# Patient Record
Sex: Female | Born: 1980 | Race: White | Hispanic: No | Marital: Married | Smoking: Never smoker
Health system: Southern US, Community
[De-identification: ages and names within clinical notes are randomized; demographics above are authoritative.]

## PROBLEM LIST (undated history)

## (undated) DIAGNOSIS — IMO0002 Reserved for concepts with insufficient information to code with codable children: Secondary | ICD-10-CM

## (undated) DIAGNOSIS — E049 Nontoxic goiter, unspecified: Secondary | ICD-10-CM

## (undated) DIAGNOSIS — N200 Calculus of kidney: Secondary | ICD-10-CM

## (undated) DIAGNOSIS — N390 Urinary tract infection, site not specified: Secondary | ICD-10-CM

## (undated) DIAGNOSIS — B379 Candidiasis, unspecified: Secondary | ICD-10-CM

## (undated) HISTORY — DX: Urinary tract infection, site not specified: N39.0

## (undated) HISTORY — PX: HYSTEROSCOPY: SHX211

## (undated) HISTORY — DX: Candidiasis, unspecified: B37.9

## (undated) HISTORY — DX: Reserved for concepts with insufficient information to code with codable children: IMO0002

## (undated) HISTORY — PX: WISDOM TOOTH EXTRACTION: SHX21

## (undated) HISTORY — DX: Nontoxic goiter, unspecified: E04.9

## (undated) HISTORY — DX: Calculus of kidney: N20.0

---

## 2004-04-19 ENCOUNTER — Other Ambulatory Visit: Admission: RE | Admit: 2004-04-19 | Discharge: 2004-04-19 | Payer: Self-pay | Admitting: Obstetrics and Gynecology

## 2005-01-24 ENCOUNTER — Other Ambulatory Visit: Admission: RE | Admit: 2005-01-24 | Discharge: 2005-01-24 | Payer: Self-pay | Admitting: Obstetrics and Gynecology

## 2005-01-25 ENCOUNTER — Ambulatory Visit (HOSPITAL_COMMUNITY): Admission: RE | Admit: 2005-01-25 | Discharge: 2005-01-25 | Payer: Self-pay | Admitting: Endocrinology

## 2005-05-15 ENCOUNTER — Other Ambulatory Visit: Admission: RE | Admit: 2005-05-15 | Discharge: 2005-05-15 | Payer: Self-pay | Admitting: Obstetrics and Gynecology

## 2006-05-17 ENCOUNTER — Other Ambulatory Visit: Admission: RE | Admit: 2006-05-17 | Discharge: 2006-05-17 | Payer: Self-pay | Admitting: Obstetrics & Gynecology

## 2007-06-06 ENCOUNTER — Other Ambulatory Visit: Admission: RE | Admit: 2007-06-06 | Discharge: 2007-06-06 | Payer: Self-pay | Admitting: Obstetrics and Gynecology

## 2007-10-18 ENCOUNTER — Ambulatory Visit (HOSPITAL_COMMUNITY): Admission: RE | Admit: 2007-10-18 | Discharge: 2007-10-18 | Payer: Self-pay | Admitting: Obstetrics & Gynecology

## 2008-06-09 ENCOUNTER — Other Ambulatory Visit: Admission: RE | Admit: 2008-06-09 | Discharge: 2008-06-09 | Payer: Self-pay | Admitting: Obstetrics & Gynecology

## 2011-01-31 NOTE — Op Note (Signed)
Kelsey Cox, Kelsey Cox             ACCOUNT NO.:  [id number]   MEDICAL RECORD NO.:  [id number]          PATIENT TYPE:  AMB   LOCATION:  SDC                           FACILITY:  [facility]   PHYSICIAN:  M. Leda Quail, MD  DATE OF BIRTH:  [birth year]   DATE OF PROCEDURE:  10/18/2007  DATE OF DISCHARGE:                               OPERATIVE REPORT   PREOPERATIVE DIAGNOSES:  1. [age]-year-old gravida 0 single white female with postcoital      bleeding.  2. Endocervical polyp.  3. Goiter.   POSTOPERATIVE DIAGNOSES:  1. [age]-year-old gravida 0 single white female with postcoital      bleeding.  2. Endocervical polyp.  3. Goiter.   PROCEDURE:  1. Hysteroscopy.  2. Dilatation and curettage.   SURGEON:  M. Leda Quail, MD   ASSISTANT:  OR staff.   ANESTHESIA:  MAC.   FINDINGS:  Small endocervical polyps at the internal os of the cervix.   SPECIMENS:  Endometrial curettings.   DISPOSITION OF SPECIMEN:  Sent to Pathology.   ESTIMATED BLOOD LOSS:  Minimal.   FLUIDS:  500 mL of LR.   URINE OUTPUT:  50 mL.   COMPLICATIONS:  None.   INDICATIONS:  Kelsey Cox is a very nice 30 year old G0 single white  female with a history of postcoital bleeding.  She has a sonohysterogram  in the office that showed a polyp at the level of the internal os.  She  and I discussed conservative management versus removal.  She has been  counseled on risks and benefits.  The patient has decided to go ahead  and proceed with this.  She has not been assured any guarantees about  this and knows that she may even continue with the bleeding with this  gone.  The patient is here with her mother and ready to proceed.   PROCEDURE:  The patient was taken to the operating room.  She was placed  in supine position.  MAC anesthesia was administered by the anesthesia  staff without difficulty; Dr. Jean Rosenthal oversaw the case.  After  anesthesia had been induced, the patient's legs were positioned  in the  dorsal lithotomy position in the Dixon stirrups.  Perineum, inner  thighs, and vagina were prepped and draped in the normal sterile  fashion.  The legs were lifted up.  A red rubber Foley catheter was used  to drain the bladder of all urine.  A bivalve speculum was placed in the  vagina.  The cervix was well visualized.  The anterior lip of the cervix  was grasped with a single-tooth tenaculum.  The cervix was anesthetized  with 1% lidocaine mixed 1:1 with epinephrine (1:100,000 units); 10 mL in  total were instilled in the cervix; this instillation was placed at the  3, 6, 9 and 12 o'clock position.  Using a sound, the uterus sounds to 7  cm.  Then using Pratt dilators, the cervix dilated up to #17.  A 2.9-mm  hysteroscope was obtained.  It was passed through the cervical os.  Sorbitol 3% was used for the procedure.  The endometrial cavity  was well  visualized.  Tubal ostia were noted.  There was a small polyp that was  noted at the level of the internal os.  Scope was removed.  A #1 curette  was used to curette the entire endometrial cavity till a rough gritty  texture was noted.  A Kevorkian endocervical curette was used to curette  the endocervical canal.  The small polyp was obtained in this.  Using  the scope, the canal and cavity were revisualized.  At this point, the  procedure was ended.  All instruments were removed from the vagina.  The  tenaculum was removed from the cervix.  The patient did have a little  bleeding from the cervix, which was made hemostatic with silver nitrate.   The patient tolerated the procedure well.  She was awakened from  anesthesia.  Sponge, lap, needle and instrument counts were correct x2.  She was taken to the recovery room in stable addition.      Kelsey Keas, MD  Electronically Signed     MSM/MEDQ  D:  10/18/2007  T:  10/18/2007  Job:  [id number]

## 2011-06-09 LAB — URINALYSIS, ROUTINE W REFLEX MICROSCOPIC
Glucose, UA: NEGATIVE
Hgb urine dipstick: NEGATIVE
Nitrite: NEGATIVE
Specific Gravity, Urine: 1.02
pH: 6

## 2011-06-09 LAB — CBC
MCV: 90.6
RDW: 12.4

## 2013-03-05 ENCOUNTER — Telehealth: Payer: Self-pay | Admitting: Obstetrics & Gynecology

## 2013-03-05 NOTE — Telephone Encounter (Signed)
Patient was referred to [facility]. She was to see Dr. Levonne Hubert . She call to let us that he has left the practice unexpectedly ? She would like to know where he went or what? Were we aware ?  Would like to speak to Dr. Hyacinth Meeker herself if possible. Patient is very upset and wants to know where to go from here.

## 2013-03-06 ENCOUNTER — Telehealth: Payer: Self-pay | Admitting: *Deleted

## 2013-03-06 NOTE — Telephone Encounter (Signed)
Return call to sally

## 2013-03-06 NOTE — Telephone Encounter (Signed)
Returning call to patient, LMTCB.

## 2013-03-06 NOTE — Telephone Encounter (Signed)
Call to patient and notified that Dr April Manson will be opening a new practice and phone number of [phone] is given to patient.  Advised I do not know the specifics of opening.  She will contact that office and check on it and if needed will check back with [place] to see if we can help her with Femara until he opens.  She was preparing for IUI which she knows we cant do but she would at least be interested in staying on Femara.  She is aware to call back if she needs assistance after speaking with the new office.

## 2013-09-26 ENCOUNTER — Encounter: Payer: Self-pay | Admitting: Obstetrics & Gynecology

## 2013-09-29 ENCOUNTER — Encounter: Payer: Self-pay | Admitting: Obstetrics & Gynecology

## 2013-09-29 ENCOUNTER — Ambulatory Visit (INDEPENDENT_AMBULATORY_CARE_PROVIDER_SITE_OTHER): Payer: BC Managed Care – PPO | Admitting: Obstetrics & Gynecology

## 2013-09-29 VITALS — BP 118/78 | HR 68 | Resp 16 | Ht 66.75 in | Wt 132.8 lb

## 2013-09-29 DIAGNOSIS — Z0000 Encounter for general adult medical examination without abnormal findings: Secondary | ICD-10-CM

## 2013-09-29 DIAGNOSIS — Z01419 Encounter for gynecological examination (general) (routine) without abnormal findings: Secondary | ICD-10-CM

## 2013-09-29 LAB — POCT URINALYSIS DIPSTICK
Bilirubin, UA: NEGATIVE
Glucose, UA: NEGATIVE
KETONES UA: NEGATIVE
Leukocytes, UA: NEGATIVE
Nitrite, UA: NEGATIVE
PH UA: 7
PROTEIN UA: NEGATIVE
RBC UA: NEGATIVE
UROBILINOGEN UA: NEGATIVE

## 2013-09-29 NOTE — Patient Instructions (Signed)

## 2013-09-29 NOTE — Progress Notes (Signed)
33 y.o. [id number] Married CaucasianF here for annual exam.  Cycles are regular.  Seeing Dr. April Manson for fertility issues.  Took Clomid for several months but endometrial lining was very thin.  Used Kelsey Cox.  Also, used injectables.   Did have a pregnancy but miscarried.  Kelsey Cox was about [redacted] weeks along.  That cycle was heavier and with clots.    Kelsey Cox's last menstrual period was 09/08/2013.          Sexually active: yes  The current method of family planning is none.    Exercising: yes  walking on occasion Smoker:  no  Health Maintenance: Pap:  08/23/12 WNL/negative HR HPV History of abnormal Pap:  no MMG:  none Colonoscopy:  none BMD:   none TDaP:  10/12 Screening Labs: n/a today, Hb today: 13.2, Urine today: PH-7.0   reports that Kelsey Cox has never smoked. Kelsey Cox has never used smokeless tobacco. Kelsey Cox reports that Kelsey Cox drinks about 3.5 ounces of alcohol per week. Kelsey Cox reports that Kelsey Cox does not use illicit drugs.  Past Medical History  Diagnosis Date  . Renal stone     age 36, passed  . Euthyroid goiter   . Yeast infection     chronic  . UTI (urinary tract infection)   . 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 Hysteroscopy      with polyp resection  . Wisdom tooth extraction      Current Outpatient Prescriptions  Medication Sig Dispense Refill  . Ascorbic Acid (VITAMIN C PO) Take by mouth daily.      . Coenzyme Q10 (CO Q-10 PO) Take by mouth daily.      . Prenatal Vit-Fe Fumarate-FA (PRENATAL ONE DAILY PO) Take by mouth.      . Probiotic Product (PROBIOTIC DAILY PO) Take by mouth. jarrow daily       No current facility-administered medications for this visit.    Family History  Problem Relation Age of Onset  . Diabetes Father   . Diabetes Paternal Grandmother   . Breast cancer Mother 23  . Breast cancer Maternal Grandmother 22  . Hypertension Father   . Heart attack Paternal Grandfather   . Cancer Paternal Grandmother     GI  cancer, lung cancer    ROS:  Pertinent items are noted in HPI.  Otherwise, a comprehensive ROS was negative.  Exam:   BP 118/78  Pulse 68  Resp 16  Ht 5' 6.75" (1.695 m)  Wt 132 lb 12.8 oz (60.238 kg)  BMI 20.97 kg/m2  LMP 09/08/2013  Weight change: +3lb   Height: 5' 6.75" (169.5 cm)  Ht Readings from Last 3 Encounters:  09/29/13 5' 6.75" (1.695 m)    General appearance: alert, cooperative and appears stated age Head: Normocephalic, without obvious abnormality, atraumatic Neck: no adenopathy, supple, symmetrical, trachea midline and thyroid normal to inspection and palpation Lungs: clear to auscultation bilaterally Breasts: normal appearance, no masses or tenderness Heart: regular rate and rhythm Abdomen: soft, non-tender; bowel sounds normal; no masses,  no organomegaly Extremities: extremities normal, atraumatic, no cyanosis or edema Skin: Skin color, texture, turgor normal. No rashes or lesions Lymph nodes: Cervical, supraclavicular, and axillary nodes normal. No abnormal inguinal nodes palpated Neurologic: Grossly normal   Pelvic: External genitalia:  no lesions              Urethra:  normal appearing urethra with no masses, tenderness or lesions  Bartholins and Skenes: normal                 Vagina: normal appearing vagina with normal color and discharge, no lesions              Cervix: no lesions              Pap taken: no Bimanual Exam:  Uterus:  normal size, contour, position, consistency, mobility, non-tender              Adnexa: normal adnexa and no mass, fullness, tenderness               Rectovaginal: Confirms               Anus:  normal sphincter tone, no lesions  A:  Well Woman with normal exam Primary infertility, planning IVF  P:   Mammogram starting age 33 pap smear 08/23/12 with neg HR HPV.  No pap indicated today. No labs today. return annually or prn  An After Visit Summary was printed and given to the Kelsey Cox.

## 2013-09-30 LAB — HEMOGLOBIN, FINGERSTICK: HEMOGLOBIN, FINGERSTICK: 13.2 g/dL (ref 12.0–16.0)

## 2013-09-30 LAB — TSH: TSH: 2.91 u[IU]/mL (ref 0.350–4.500)

## 2013-10-06 ENCOUNTER — Telehealth: Payer: Self-pay | Admitting: Obstetrics & Gynecology

## 2013-10-06 NOTE — Telephone Encounter (Signed)
Return call to patient. Patient is checking on TSH from last week since she is early pregnant.  She states she had faint positive UPT's at home and has been to Dr April MansonYalcinkaya and is waiting to hear back from them on blood test results.  Advised TSH from last week WNL as well as HGB.  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 Requested I document this in her chart and let Dr Hyacinth MeekerMiller know.  Will fax labs to Dr April MansonYalcinkaya.  Routing to provider for final review. Patient agreeable to disposition. Will close encounter

## 2013-10-06 NOTE — Telephone Encounter (Signed)
Patient is asking for recent lab results.

## 2014-05-29 ENCOUNTER — Encounter (HOSPITAL_COMMUNITY): Payer: Self-pay

## 2014-06-01 ENCOUNTER — Encounter (HOSPITAL_COMMUNITY)
Admission: AD | Disposition: A | Discharge status: Home / Self Care | Payer: Self-pay | Source: Ambulatory Visit | Attending: Obstetrics and Gynecology

## 2014-06-01 ENCOUNTER — Encounter (HOSPITAL_COMMUNITY): Payer: Self-pay | Admitting: *Deleted

## 2014-06-01 ENCOUNTER — Encounter (HOSPITAL_COMMUNITY): Payer: BC Managed Care – PPO | Admitting: Anesthesiology

## 2014-06-01 ENCOUNTER — Inpatient Hospital Stay (HOSPITAL_COMMUNITY)
Admission: AD | Admit: 2014-06-01 | Discharge: 2014-06-04 | DRG: 766 | Disposition: A | Discharge status: Home / Self Care | Payer: BC Managed Care – PPO | Source: Ambulatory Visit | Attending: Obstetrics and Gynecology | Admitting: Obstetrics and Gynecology

## 2014-06-01 ENCOUNTER — Inpatient Hospital Stay (HOSPITAL_COMMUNITY): Payer: BC Managed Care – PPO | Admitting: Anesthesiology

## 2014-06-01 DIAGNOSIS — O321XX0 Maternal care for breech presentation, not applicable or unspecified: Secondary | ICD-10-CM | POA: Diagnosis present

## 2014-06-01 DIAGNOSIS — Z8249 Family history of ischemic heart disease and other diseases of the circulatory system: Secondary | ICD-10-CM | POA: Diagnosis not present

## 2014-06-01 DIAGNOSIS — Z87442 Personal history of urinary calculi: Secondary | ICD-10-CM | POA: Diagnosis not present

## 2014-06-01 DIAGNOSIS — Z803 Family history of malignant neoplasm of breast: Secondary | ICD-10-CM | POA: Diagnosis not present

## 2014-06-01 DIAGNOSIS — Z833 Family history of diabetes mellitus: Secondary | ICD-10-CM

## 2014-06-01 LAB — TYPE AND SCREEN
ABO/RH(D): O POS
ANTIBODY SCREEN: NEGATIVE

## 2014-06-01 LAB — CBC
HCT: 38.3 % (ref 36.0–46.0)
Hemoglobin: 13.3 g/dL (ref 12.0–15.0)
MCH: 31.7 pg (ref 26.0–34.0)
MCHC: 34.7 g/dL (ref 30.0–36.0)
MCV: 91.4 fL (ref 78.0–100.0)
Platelets: 146 10*3/uL — ABNORMAL LOW (ref 150–400)
RBC: 4.19 MIL/uL (ref 3.87–5.11)
RDW: 13.5 % (ref 11.5–15.5)
WBC: 13.2 10*3/uL — AB (ref 4.0–10.5)

## 2014-06-01 SURGERY — Surgical Case
Anesthesia: Spinal | Site: Abdomen

## 2014-06-01 MED ORDER — BUPIVACAINE HCL (PF) 0.25 % IJ SOLN
INTRAMUSCULAR | Status: DC | PRN
  Administered 2014-06-01: 10 mL

## 2014-06-01 MED ORDER — CITRIC ACID-SODIUM CITRATE 334-500 MG/5ML PO SOLN
30.0000 mL | Freq: Once | ORAL | Status: AC
  Administered 2014-06-01: 30 mL via ORAL
  Filled 2014-06-01: qty 15

## 2014-06-01 MED ORDER — LACTATED RINGERS IV SOLN
INTRAVENOUS | Status: DC | PRN
  Administered 2014-06-01 (×2): via INTRAVENOUS

## 2014-06-01 MED ORDER — PHENYLEPHRINE 8 MG IN D5W 100 ML (0.08MG/ML) PREMIX OPTIME
INJECTION | INTRAVENOUS | Status: DC | PRN
  Administered 2014-06-01: 60 ug/min via INTRAVENOUS

## 2014-06-01 MED ORDER — SCOPOLAMINE 1 MG/3DAYS TD PT72
MEDICATED_PATCH | TRANSDERMAL | Status: AC
  Administered 2014-06-01: 1.5 mg via TRANSDERMAL
  Filled 2014-06-01: qty 1

## 2014-06-01 MED ORDER — FENTANYL CITRATE 0.05 MG/ML IJ SOLN
INTRAMUSCULAR | Status: DC | PRN
  Administered 2014-06-01: 20 ug via INTRATHECAL

## 2014-06-01 MED ORDER — ONDANSETRON HCL 4 MG/2ML IJ SOLN
INTRAMUSCULAR | Status: AC
  Filled 2014-06-01: qty 2

## 2014-06-01 MED ORDER — OXYTOCIN 10 UNIT/ML IJ SOLN
40.0000 [IU] | INTRAVENOUS | Status: DC | PRN
  Administered 2014-06-01: 40 [IU] via INTRAVENOUS

## 2014-06-01 MED ORDER — SCOPOLAMINE 1 MG/3DAYS TD PT72
1.0000 | MEDICATED_PATCH | Freq: Once | TRANSDERMAL | Status: DC
  Administered 2014-06-01: 1.5 mg via TRANSDERMAL

## 2014-06-01 MED ORDER — BUPIVACAINE IN DEXTROSE 0.75-8.25 % IT SOLN
INTRATHECAL | Status: DC | PRN
  Administered 2014-06-01: 1.6 mL via INTRATHECAL

## 2014-06-01 MED ORDER — BUPIVACAINE HCL (PF) 0.25 % IJ SOLN
INTRAMUSCULAR | Status: AC
  Filled 2014-06-01: qty 10

## 2014-06-01 MED ORDER — PHENYLEPHRINE 8 MG IN D5W 100 ML (0.08MG/ML) PREMIX OPTIME
INJECTION | INTRAVENOUS | Status: AC
  Filled 2014-06-01: qty 100

## 2014-06-01 MED ORDER — 0.9 % SODIUM CHLORIDE (POUR BTL) OPTIME
TOPICAL | Status: DC | PRN
  Administered 2014-06-01: 1000 mL

## 2014-06-01 MED ORDER — OXYTOCIN 10 UNIT/ML IJ SOLN
INTRAMUSCULAR | Status: AC
  Filled 2014-06-01: qty 4

## 2014-06-01 MED ORDER — CEFAZOLIN SODIUM-DEXTROSE 2-3 GM-% IV SOLR
2.0000 g | INTRAVENOUS | Status: AC
  Administered 2014-06-01: 2 g via INTRAVENOUS
  Filled 2014-06-01: qty 50

## 2014-06-01 MED ORDER — MORPHINE SULFATE 0.5 MG/ML IJ SOLN
INTRAMUSCULAR | Status: AC
  Filled 2014-06-01: qty 10

## 2014-06-01 MED ORDER — FENTANYL CITRATE 0.05 MG/ML IJ SOLN
INTRAMUSCULAR | Status: AC
  Filled 2014-06-01: qty 2

## 2014-06-01 MED ORDER — LACTATED RINGERS IV SOLN
INTRAVENOUS | Status: DC | PRN
  Administered 2014-06-01: 23:00:00 via INTRAVENOUS

## 2014-06-01 MED ORDER — MORPHINE SULFATE (PF) 0.5 MG/ML IJ SOLN
INTRAMUSCULAR | Status: DC | PRN
  Administered 2014-06-01: .2 mg via INTRATHECAL

## 2014-06-01 MED ORDER — DEXTROSE IN LACTATED RINGERS 5 % IV SOLN
INTRAVENOUS | Status: DC
  Administered 2014-06-01: 21:00:00 via INTRAVENOUS

## 2014-06-01 MED ORDER — ONDANSETRON HCL 4 MG/2ML IJ SOLN
INTRAMUSCULAR | Status: DC | PRN
  Administered 2014-06-01: 4 mg via INTRAVENOUS

## 2014-06-01 SURGICAL SUPPLY — 40 items
ADH SKN CLS LQ APL DERMABOND (GAUZE/BANDAGES/DRESSINGS) ×1
BLADE SURG 10 STRL SS (BLADE) ×4 IMPLANT
CLAMP CORD UMBIL (MISCELLANEOUS) IMPLANT
CLOTH BEACON ORANGE TIMEOUT ST (SAFETY) ×2 IMPLANT
CONTAINER PREFILL 10% NBF 15ML (MISCELLANEOUS) IMPLANT
DERMABOND ADHESIVE PROPEN (GAUZE/BANDAGES/DRESSINGS) ×1
DERMABOND ADVANCED .7 DNX6 (GAUZE/BANDAGES/DRESSINGS) IMPLANT
DRAPE LG THREE QUARTER DISP (DRAPES) IMPLANT
DRSG OPSITE POSTOP 4X10 (GAUZE/BANDAGES/DRESSINGS) ×2 IMPLANT
DURAPREP 26ML APPLICATOR (WOUND CARE) ×2 IMPLANT
ELECT REM PT RETURN 9FT ADLT (ELECTROSURGICAL) ×2
ELECTRODE REM PT RTRN 9FT ADLT (ELECTROSURGICAL) ×1 IMPLANT
EXTRACTOR VACUUM M CUP 4 TUBE (SUCTIONS) IMPLANT
GLOVE BIO SURGEON STRL SZ7.5 (GLOVE) ×2 IMPLANT
GOWN STRL REUS W/TWL LRG LVL3 (GOWN DISPOSABLE) ×4 IMPLANT
KIT ABG SYR 3ML LUER SLIP (SYRINGE) IMPLANT
NDL HYPO 25X1 1.5 SAFETY (NEEDLE) ×1 IMPLANT
NDL HYPO 25X5/8 SAFETYGLIDE (NEEDLE) IMPLANT
NDL SPNL 20GX3.5 QUINCKE YW (NEEDLE) IMPLANT
NEEDLE HYPO 25X1 1.5 SAFETY (NEEDLE) ×2 IMPLANT
NEEDLE HYPO 25X5/8 SAFETYGLIDE (NEEDLE) IMPLANT
NEEDLE SPNL 20GX3.5 QUINCKE YW (NEEDLE) IMPLANT
NS IRRIG 1000ML POUR BTL (IV SOLUTION) ×2 IMPLANT
PACK C SECTION WH (CUSTOM PROCEDURE TRAY) ×2 IMPLANT
STAPLER VISISTAT 35W (STAPLE) IMPLANT
SUT MNCRL 0 VIOLET CTX 36 (SUTURE) ×2 IMPLANT
SUT MNCRL AB 3-0 PS2 27 (SUTURE) IMPLANT
SUT MON AB 2-0 CT1 27 (SUTURE) ×2 IMPLANT
SUT MON AB-0 CT1 36 (SUTURE) ×4 IMPLANT
SUT MONOCRYL 0 CTX 36 (SUTURE) ×2
SUT PLAIN 0 NONE (SUTURE) IMPLANT
SUT PLAIN 2 0 (SUTURE)
SUT PLAIN 2 0 XLH (SUTURE) IMPLANT
SUT PLAIN ABS 2-0 CT1 27XMFL (SUTURE) IMPLANT
SYRINGE 20CC LL (MISCELLANEOUS) IMPLANT
SYRINGE CONTROL L 12CC (SYRINGE) ×2 IMPLANT
SYRINGE CONTROL LL 12CC (SYRINGE) ×1 IMPLANT
TOWEL OR 17X24 6PK STRL BLUE (TOWEL DISPOSABLE) ×2 IMPLANT
TRAY FOLEY CATH 14FR (SET/KITS/TRAYS/PACK) ×2 IMPLANT
WATER STERILE IRR 1000ML POUR (IV SOLUTION) ×2 IMPLANT

## 2014-06-01 NOTE — Anesthesia Preprocedure Evaluation (Signed)
Anesthesia Evaluation  Patient identified by MRN, date of birth, ID band Patient awake    Reviewed: Allergy & Precautions, H&P , NPO status , Patient's Chart, lab work & pertinent test results  History of Anesthesia Complications Negative for: history of anesthetic complications  Airway Mallampati: II TM Distance: >3 FB Neck ROM: Full    Dental  (+) Teeth Intact   Pulmonary neg pulmonary ROS,  breath sounds clear to auscultation        Cardiovascular negative cardio ROS  Rhythm:Regular     Neuro/Psych negative neurological ROS     GI/Hepatic negative GI ROS, Neg liver ROS,   Endo/Other  negative endocrine ROS  Renal/GU negative Renal ROS     Musculoskeletal   Abdominal   Peds  Hematology negative hematology ROS (+)   Anesthesia Other Findings   Reproductive/Obstetrics (+) Pregnancy                           Anesthesia Physical Anesthesia Plan  ASA: II and emergent  Anesthesia Plan: Spinal   Post-op Pain Management:    Induction:   Airway Management Planned: Natural Airway  Additional Equipment:   Intra-op Plan:   Post-operative Plan:   Informed Consent: I have reviewed the patients History and Physical, chart, labs and discussed the procedure including the risks, benefits and alternatives for the proposed anesthesia with the patient or authorized representative who has indicated his/her understanding and acceptance.   Dental advisory given  Plan Discussed with: CRNA and Surgeon  Anesthesia Plan Comments:         Anesthesia Quick Evaluation

## 2014-06-01 NOTE — MAU Note (Signed)
PT SAYS SHE WAS IN OFFICE TODAY-   THINKS SROM  AT 745PM.    VE - FT.  DENIES HSV AND MRSA.  GBS-  NEG.    BABY - BREECH

## 2014-06-01 NOTE — Transfer of Care (Signed)
Immediate Anesthesia Transfer of Care Note  Patient: Kelsey Cox  Procedure(s) Performed: Procedure(s): CESAREAN SECTION (N/A)  Patient Location: PACU  Anesthesia Type:Spinal  Level of Consciousness: awake, alert  and oriented  Airway & Oxygen Therapy: Patient Spontanous Breathing  Post-op Assessment: Report given to PACU RN and Post -op Vital signs reviewed and stable  Post vital signs: Reviewed and stable  Complications: No apparent anesthesia complications

## 2014-06-01 NOTE — Op Note (Signed)
Cesarean Section Procedure Note  Indications: malpresentation: complete breech and SROM in labor  Pre-operative Diagnosis: 38 week 2 day pregnancy.  Post-operative Diagnosis: same  Surgeon: Lenoard Aden   Assistants: Denyse Amass, CNM  Anesthesia: Local anesthesia 0.25.% bupivacaine and Spinal anesthesia  ASA Class: 2  Procedure Details  The patient was seen in the Holding Room. The risks, benefits, complications, treatment options, and expected outcomes were discussed with the patient.  The patient concurred with the proposed plan, giving informed consent. The risks of anesthesia, infection, bleeding and possible injury to other organs discussed. Injury to bowel, bladder, or ureter with possible need for repair discussed. Possible need for transfusion with secondary risks of hepatitis or HIV acquisition discussed. Post operative complications to include but not limited to DVT, PE and Pneumonia noted. The site of surgery properly noted/marked. The patient was taken to Operating Room # 9, identified as AIRI COPADO and the procedure verified as C-Section Delivery. A Time Out was held and the above information confirmed.  After induction of anesthesia, the patient was draped and prepped in the usual sterile manner. A Pfannenstiel incision was made and carried down through the subcutaneous tissue to the fascia. Fascial incision was made and extended transversely using Mayo scissors. The fascia was separated from the underlying rectus tissue superiorly and inferiorly. The peritoneum was identified and entered. Peritoneal incision was extended longitudinally. The utero-vesical peritoneal reflection was incised transversely and the bladder flap was bluntly freed from the lower uterine segment. A low transverse uterine incision(Kerr hysterotomy) was made. Delivered from complete breech presentation with standard maneuvers was a  female with Apgar scores of 9 at one minute and 9 at five minutes. Bulb  suctioning gently performed. Neonatal team in attendance.After the umbilical cord was clamped and cut cord blood was obtained for evaluation. The placenta was removed intact and appeared normal. The uterus was curetted with a dry lap pack. Good hemostasis was noted.The uterine outline, tubes and ovaries appeared normal. The uterine incision was closed with running locked sutures of 0 Monocryl x 2 layers. Hemostasis was observed. Lavage was carried out until clear.The parietal peritoneum was closed with a running 2-0 Monocryl suture. The fascia was then reapproximated with running sutures of 0 Monocryl. The skin was reapproximated with 3-39monocryl after 2-0 plain on [place] layer.  Instrument, sponge, and needle counts were correct prior the abdominal closure and at the conclusion of the case.   Findings: As above  Estimated Blood Loss:  300 mL         Drains: foley                 Specimens: placenta                 Complications:  None; patient tolerated the procedure well.         Disposition: PACU - hemodynamically stable.         Condition: stable  Attending Attestation: I performed the procedure.

## 2014-06-01 NOTE — Anesthesia Procedure Notes (Signed)
Spinal  Patient location during procedure: OR Start time: 06/01/2014 10:07 PM End time: 06/01/2014 10:11 PM Staffing Anesthesiologist: Xaniyah Buchholz, CHRIS Preanesthetic Checklist Completed: patient identified, surgical consent, pre-op evaluation, timeout performed, IV checked, risks and benefits discussed and monitors and equipment checked Spinal Block Patient position: sitting Prep: site prepped and draped and DuraPrep Patient monitoring: heart rate, cardiac monitor, continuous pulse ox and blood pressure Approach: midline Location: L4-5 Injection technique: single-shot Needle Needle type: Pencan  Needle gauge: 24 G Needle length: 10 cm Assessment Sensory level: T4

## 2014-06-01 NOTE — H&P (Signed)
Kelsey Cox is a 33 y.o. female presenting for sROM and breech.  Maternal Medical History:  Reason for admission: Rupture of membranes.   Contractions: Onset was less than 1 hour ago.   Frequency: irregular.   Perceived severity is mild.    Fetal activity: Perceived fetal activity is normal.   Last perceived fetal movement was within the past hour.    Prenatal complications: no prenatal complications Prenatal Complications - Diabetes: none.    OB History   Grav Para Term Preterm Abortions TAB SAB Ect Mult Living   2 0   1  1   0     Past Medical History  Diagnosis Date  . Renal stone     age 1, passed  . Euthyroid goiter   . Yeast infection     chronic  . UTI (urinary tract infection)   . 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 Hysteroscopy      with polyp resection  . Wisdom tooth extraction     Family History: family history includes Breast cancer (age of onset: 76) in her mother; Breast cancer (age of onset: 43) in her maternal grandmother; Cancer in her paternal grandmother; Diabetes in her father and paternal grandmother; Heart attack in her paternal grandfather; Hypertension in her father. Social History:  reports that she has never smoked. She has never used smokeless tobacco. She reports that she drinks about 3.5 ounces of alcohol per week. She reports that she does not use illicit drugs.   Prenatal Transfer Tool  Maternal Diabetes: No Genetic Screening: Normal Maternal Ultrasounds/Referrals: Normal Fetal Ultrasounds or other Referrals:  None Maternal Substance Abuse:  No Significant Maternal Medications:  None Significant Maternal Lab Results:  None Other Comments:  None  Review of Systems  All other systems reviewed and are negative.     Blood pressure 140/79, pulse 79, temperature 98.9 F (37.2 C), temperature source Oral, resp. rate 20, height 5' 5.5" (1.664 m), weight 67.699 kg (149 lb 4 oz),  last menstrual period 09/08/2013, unknown if currently breastfeeding. Maternal Exam:  Uterine Assessment: Contraction strength is mild.  Contraction frequency is irregular.   Abdomen: Patient reports no abdominal tenderness. Fetal presentation: breech  Introitus: Normal vulva. Normal vagina.  Ferning test: positive.  Nitrazine test: positive. Amniotic fluid character: clear.  Pelvis: adequate for delivery.   Cervix: Cervix evaluated by sterile speculum exam and digital exam.     Physical Exam  Nursing note and vitals reviewed. Constitutional: She is oriented to person, place, and time. She appears well-developed and well-nourished.  HENT:  Head: Normocephalic.  Eyes: Pupils are equal, round, and reactive to light.  Neck: Normal range of motion. Neck supple.  Cardiovascular: Normal rate and regular rhythm.   Respiratory: Effort normal and breath sounds normal.  GI: Soft.  Genitourinary: Vagina normal and uterus normal.  Musculoskeletal: Normal range of motion.  Neurological: She is alert and oriented to person, place, and time.  Skin: Skin is warm and dry.  Psychiatric: She has a normal mood and affect. Her behavior is normal. Judgment and thought content normal.    Prenatal labs: ABO, Rh:   Antibody:   Rubella:   RPR:    HBsAg:    HIV:    GBS:     Assessment/Plan: SROM with malpresentation. Advanced dilatation with unstable lie Proceed with Csection. Risks vs benefits of surgery previously discussed. OR notified.   Kayton Dunaj J  06/01/2014, 9:08 PM

## 2014-06-02 ENCOUNTER — Encounter (HOSPITAL_COMMUNITY): Payer: Self-pay | Admitting: Anesthesiology

## 2014-06-02 ENCOUNTER — Encounter (HOSPITAL_COMMUNITY): Payer: Self-pay | Admitting: *Deleted

## 2014-06-02 DIAGNOSIS — O321XX0 Maternal care for breech presentation, not applicable or unspecified: Secondary | ICD-10-CM | POA: Diagnosis present

## 2014-06-02 LAB — ABO/RH: ABO/RH(D): O POS

## 2014-06-02 LAB — CBC
HCT: 33.9 % — ABNORMAL LOW (ref 36.0–46.0)
HEMOGLOBIN: 11.7 g/dL — AB (ref 12.0–15.0)
MCH: 31.3 pg (ref 26.0–34.0)
MCHC: 34.5 g/dL (ref 30.0–36.0)
MCV: 90.6 fL (ref 78.0–100.0)
Platelets: 122 10*3/uL — ABNORMAL LOW (ref 150–400)
RBC: 3.74 MIL/uL — ABNORMAL LOW (ref 3.87–5.11)
RDW: 13.5 % (ref 11.5–15.5)
WBC: 12.5 10*3/uL — ABNORMAL HIGH (ref 4.0–10.5)

## 2014-06-02 MED ORDER — ONDANSETRON HCL 4 MG PO TABS: 4.0000 mg | ORAL_TABLET | ORAL | Status: DC | PRN

## 2014-06-02 MED ORDER — FENTANYL CITRATE 0.05 MG/ML IJ SOLN: 25.0000 ug | INTRAMUSCULAR | Status: DC | PRN

## 2014-06-02 MED ORDER — DIPHENHYDRAMINE HCL 50 MG/ML IJ SOLN: 12.5000 mg | INTRAMUSCULAR | Status: DC | PRN

## 2014-06-02 MED ORDER — KETOROLAC TROMETHAMINE 30 MG/ML IJ SOLN: 30.0000 mg | Freq: Four times a day (QID) | INTRAMUSCULAR | Status: AC | PRN

## 2014-06-02 MED ORDER — SIMETHICONE 80 MG PO CHEW
80.0000 mg | CHEWABLE_TABLET | Freq: Three times a day (TID) | ORAL | Status: DC
  Administered 2014-06-02 – 2014-06-04 (×7): 80 mg via ORAL
  Filled 2014-06-02 (×7): qty 1

## 2014-06-02 MED ORDER — METHYLERGONOVINE MALEATE 0.2 MG/ML IJ SOLN: 0.2000 mg | INTRAMUSCULAR | Status: DC | PRN

## 2014-06-02 MED ORDER — NALBUPHINE HCL 10 MG/ML IJ SOLN: 5.0000 mg | INTRAMUSCULAR | Status: DC | PRN

## 2014-06-02 MED ORDER — ZOLPIDEM TARTRATE 5 MG PO TABS: 5.0000 mg | ORAL_TABLET | Freq: Every evening | ORAL | Status: DC | PRN

## 2014-06-02 MED ORDER — SIMETHICONE 80 MG PO CHEW: 80.0000 mg | CHEWABLE_TABLET | ORAL | Status: DC | PRN

## 2014-06-02 MED ORDER — OXYTOCIN 40 UNITS IN LACTATED RINGERS INFUSION - SIMPLE MED: 62.5000 mL/h | INTRAVENOUS | Status: AC

## 2014-06-02 MED ORDER — PRENATAL MULTIVITAMIN CH
1.0000 | ORAL_TABLET | Freq: Every day | ORAL | Status: DC
  Administered 2014-06-02 – 2014-06-04 (×3): 1 via ORAL
  Filled 2014-06-02 (×3): qty 1

## 2014-06-02 MED ORDER — TETANUS-DIPHTH-ACELL PERTUSSIS 5-2.5-18.5 LF-MCG/0.5 IM SUSP: 0.5000 mL | Freq: Once | INTRAMUSCULAR | Status: DC

## 2014-06-02 MED ORDER — OXYCODONE-ACETAMINOPHEN 5-325 MG PO TABS
1.0000 | ORAL_TABLET | ORAL | Status: DC | PRN
  Administered 2014-06-03: 1 via ORAL
  Filled 2014-06-02 (×2): qty 1

## 2014-06-02 MED ORDER — SODIUM CHLORIDE 0.9 % IJ SOLN: 3.0000 mL | INTRAMUSCULAR | Status: DC | PRN

## 2014-06-02 MED ORDER — KETOROLAC TROMETHAMINE 30 MG/ML IJ SOLN
INTRAMUSCULAR | Status: AC
  Administered 2014-06-02: 30 mg
  Filled 2014-06-02: qty 1

## 2014-06-02 MED ORDER — MENTHOL 3 MG MT LOZG: 1.0000 | LOZENGE | OROMUCOSAL | Status: DC | PRN

## 2014-06-02 MED ORDER — SENNOSIDES-DOCUSATE SODIUM 8.6-50 MG PO TABS
2.0000 | ORAL_TABLET | ORAL | Status: DC
  Administered 2014-06-02: 2 via ORAL
  Filled 2014-06-02 (×2): qty 2

## 2014-06-02 MED ORDER — LANOLIN HYDROUS EX OINT: 1.0000 "application " | TOPICAL_OINTMENT | CUTANEOUS | Status: DC | PRN

## 2014-06-02 MED ORDER — ONDANSETRON HCL 4 MG/2ML IJ SOLN: 4.0000 mg | Freq: Three times a day (TID) | INTRAMUSCULAR | Status: DC | PRN

## 2014-06-02 MED ORDER — NALOXONE HCL 0.4 MG/ML IJ SOLN: 0.4000 mg | INTRAMUSCULAR | Status: DC | PRN

## 2014-06-02 MED ORDER — INFLUENZA VAC SPLIT QUAD 0.5 ML IM SUSY: 0.5000 mL | PREFILLED_SYRINGE | INTRAMUSCULAR | Status: DC

## 2014-06-02 MED ORDER — DIPHENHYDRAMINE HCL 50 MG/ML IJ SOLN: 25.0000 mg | INTRAMUSCULAR | Status: DC | PRN

## 2014-06-02 MED ORDER — METHYLERGONOVINE MALEATE 0.2 MG PO TABS
0.2000 mg | ORAL_TABLET | ORAL | Status: DC | PRN
Start: 2014-06-02 — End: 2014-06-04

## 2014-06-02 MED ORDER — DIBUCAINE 1 % RE OINT: 1.0000 "application " | TOPICAL_OINTMENT | RECTAL | Status: DC | PRN

## 2014-06-02 MED ORDER — LACTATED RINGERS IV SOLN
INTRAVENOUS | Status: DC
  Administered 2014-06-02: 07:00:00 via INTRAVENOUS

## 2014-06-02 MED ORDER — MEPERIDINE HCL 25 MG/ML IJ SOLN: 6.2500 mg | INTRAMUSCULAR | Status: DC | PRN

## 2014-06-02 MED ORDER — METOCLOPRAMIDE HCL 5 MG/ML IJ SOLN: 10.0000 mg | Freq: Three times a day (TID) | INTRAMUSCULAR | Status: DC | PRN

## 2014-06-02 MED ORDER — IBUPROFEN 600 MG PO TABS
600.0000 mg | ORAL_TABLET | Freq: Four times a day (QID) | ORAL | Status: DC
  Administered 2014-06-02 – 2014-06-04 (×10): 600 mg via ORAL
  Filled 2014-06-02 (×10): qty 1

## 2014-06-02 MED ORDER — DIPHENHYDRAMINE HCL 25 MG PO CAPS: 25.0000 mg | ORAL_CAPSULE | Freq: Four times a day (QID) | ORAL | Status: DC | PRN

## 2014-06-02 MED ORDER — SIMETHICONE 80 MG PO CHEW
80.0000 mg | CHEWABLE_TABLET | ORAL | Status: DC
  Administered 2014-06-02 – 2014-06-03 (×2): 80 mg via ORAL
  Filled 2014-06-02 (×2): qty 1

## 2014-06-02 MED ORDER — NALOXONE HCL 1 MG/ML IJ SOLN
1.0000 ug/kg/h | INTRAVENOUS | Status: DC | PRN
  Filled 2014-06-02: qty 2

## 2014-06-02 MED ORDER — ONDANSETRON HCL 4 MG/2ML IJ SOLN: 4.0000 mg | INTRAMUSCULAR | Status: DC | PRN

## 2014-06-02 MED ORDER — DIPHENHYDRAMINE HCL 25 MG PO CAPS: 25.0000 mg | ORAL_CAPSULE | ORAL | Status: DC | PRN

## 2014-06-02 MED ORDER — OXYCODONE-ACETAMINOPHEN 5-325 MG PO TABS: 2.0000 | ORAL_TABLET | ORAL | Status: DC | PRN

## 2014-06-02 MED ORDER — WITCH HAZEL-GLYCERIN EX PADS: 1.0000 "application " | MEDICATED_PAD | CUTANEOUS | Status: DC | PRN

## 2014-06-02 NOTE — Lactation Note (Signed)
This note was copied from the chart of Girl Kaedence Connelly. Lactation Consultation Note  Patient Name: Girl Jennifr Gaeta WUXLK'G Date: 06/02/2014 Reason for consult: Initial assessment;Infant < 6lbs  Visited with Mom and FOB, baby at 104 hrs old.  Infant 38.2 weeks, delivered at 5 lbs 11.9 oz, C/S for breech. Infant very sleepy.  Baby cueing subtly and offered to assist with positioning and latch.  Teaching done while assisting.  Manual breast expression taught, colostrum in small amount expressed.  Baby opened and tried to latch with a wide mouth, but repeatedly slipped onto nipple and became slightly fussy.  Used manual breast pump and then initiated a 20mm nipple shield with instructions.  Baby able to latch deeply and take a few sucks on and off for 30 minutes.  Set up DEBP with instructions to pump about every 3 hrs to stimulate her milk supply.  Recommended skin to skin, feeding on cue.  Brochure given to Mom, and explained about IP and OP Lactation services.     Consult Status Consult Status: Follow-up Date: 06/03/14 Follow-up type: In-patient    Judee Clara 06/02/2014, 12:25 PM

## 2014-06-02 NOTE — Anesthesia Postprocedure Evaluation (Signed)
  Anesthesia Post-op Note  Patient: Kelsey Cox  Procedure(s) Performed: Procedure(s): CESAREAN SECTION (N/A)  Patient Location: PACU and Mother/Baby  Anesthesia Type:Spinal  Level of Consciousness: awake, alert  and oriented  Airway and Oxygen Therapy: Patient Spontanous Breathing  Post-op Pain: mild  Post-op Assessment: Post-op Vital signs reviewed  Post-op Vital Signs: Reviewed and stable  Last Vitals:  Filed Vitals:   06/02/14 0640  BP: 124/65  Pulse: 72  Temp:   Resp:     Complications: No apparent anesthesia complications

## 2014-06-02 NOTE — Progress Notes (Signed)
Patient ID: Kelsey Cox, female   DOB: 24-Apr-1981, 33 y.o.   MRN: [id number] Subjective: POD# 1 Information for the patient's newborn:  Chera, Slivka [[id number]]  female    Reports feeling sore, but well Feeding: breast Patient reports tolerating PO.  Breast symptoms: none Pain controlled with ibuprofen (OTC) and narcotic analgesics including Percocet Denies HA/SOB/C/P/N/V/dizziness. Flatus present x 1 episode. No BM. She reports vaginal bleeding as normal, without clots.  She is ambulating, urinating without difficult.     Objective:   VS:  Filed Vitals:   06/02/14 0630 06/02/14 0635 06/02/14 0640 06/02/14 0909  BP: 121/67 112/63 124/65 98/54  Pulse: 57 62 72 52  Temp: 98.5 F (36.9 C)   99.1 F (37.3 C)  TempSrc: Oral   Axillary  Resp: 18   18  Height:      Weight:      SpO2: 97%   96%     Intake/Output Summary (Last 24 hours) at 06/02/14 0955 Last data filed at 06/02/14 0630  Gross per 24 hour  Intake   2280 ml  Output   1100 ml  Net   1180 ml        Recent Labs  06/01/14 2115 06/02/14 0500  WBC 13.2* 12.5*  HGB 13.3 11.7*  HCT 38.3 33.9*  PLT 146* 122*     Blood type: --/--/O POS (09/14 2130)  Rubella:       Physical Exam:  General: alert, cooperative, fatigued and no distress CV: Regular rate and rhythm, S1S2 present or without murmur or extra heart sounds Resp: clear Abdomen: soft, nontender, hypoactive bowel sounds Incision: Tegaderm and Honeycomb dressing C/D/I - skin well-approximated with sutures Uterine Fundus: firm, @ umbilicus, nontender Lochia: minimal Ext: extremities normal, atraumatic, no cyanosis or edema and Homans sign is negative, no sign of DVT      Assessment/Plan: 33 y.o.   POD# 1.  s/p Cesarean Delivery.  Indications: breech and SROM                Principal Problem: Postpartum care following cesarean delivery (9/14)  Doing well, stable.               Regular diet as tolerated D/C foley per  protocol Ambulate Routine post-op care  Kenard Gower, MSN, CNM 06/02/2014, 9:55 AM

## 2014-06-03 ENCOUNTER — Encounter (HOSPITAL_COMMUNITY): Payer: Self-pay | Admitting: Obstetrics and Gynecology

## 2014-06-03 LAB — URINALYSIS, ROUTINE W REFLEX MICROSCOPIC
Bilirubin Urine: NEGATIVE
Glucose, UA: NEGATIVE mg/dL
Ketones, ur: NEGATIVE mg/dL
Leukocytes, UA: NEGATIVE
NITRITE: NEGATIVE
PH: 6 (ref 5.0–8.0)
Protein, ur: NEGATIVE mg/dL
SPECIFIC GRAVITY, URINE: 1.01 (ref 1.005–1.030)
Urobilinogen, UA: 0.2 mg/dL (ref 0.0–1.0)

## 2014-06-03 LAB — URINE MICROSCOPIC-ADD ON

## 2014-06-03 LAB — BIRTH TISSUE RECOVERY COLLECTION (PLACENTA DONATION)

## 2014-06-03 NOTE — Progress Notes (Signed)
POD # 2  Subjective: Pt reports feeling well/ Pain controlled with Motrin Tolerating po/Voiding without problems/ No n/v/ Flatus present Activity: ad lib Bleeding is light Newborn info:  Information for the patient's newborn:  Kelsey Cox, Kelsey Cox [[id number]]  female  Feeding: breast   Objective: VS:  Filed Vitals:   06/02/14 1054 06/02/14 1450 06/02/14 1835 06/03/14 0636  BP: 91/48 96/53 112/61 97/56  Pulse: 60 55 56 55  Temp: 98 F (36.7 C) 98.5 F (36.9 C) 97.9 F (36.6 C) 98.3 F (36.8 C)  TempSrc: Oral Oral Oral Oral  Resp: Height:      Weight:      SpO2: 97% 95% 98% 99%    I&O: Intake/Output     09/15 0701 - 09/16 0700 09/16 0701 - 09/17 0700   P.O.     I.V. (mL/kg) 972.9 (14.4)    Total Intake(mL/kg) 972.9 (14.4)    Urine (mL/kg/hr) 2350 (1.4)    Blood     Total Output 2350     Net -1377.1            LABS:  Recent Labs  06/01/14 2115 06/02/14 0500  WBC 13.2* 12.5*  HGB 13.3 11.7*  HCT 38.3 33.9*  PLT 146* 122*    Blood type: --/--/O POS (09/14 2130) Rubella:       Physical Exam:  General: alert and cooperative CV: Regular rate and rhythm Resp: CTA bilaterally Abdomen: soft, nontender, normal bowel sounds Uterine Fundus: firm, below umbilicus, nontender Incision: Covered with Tegaderm and honeycomb dressing; no significant drainage, edema, bruising, or erythema; well approximated with suture Lochia: minimal Ext: extremities normal, atraumatic, no cyanosis or edema and Homans sign is negative, no sign of DVT    Assessment/: POD # 2/ G3P1011/ S/P C/Section d/t breech Doing well  Plan: Continue routine post op orders Anticipate discharge home tomorrow   Signed: Donette Larry, Dorris Carnes, MSN, CNM 06/03/2014, 9:53 AM

## 2014-06-03 NOTE — Progress Notes (Signed)
Pt c/o pressure with urination; provided education on irritation from catheter, incisional area feeling like pressure, and Prophylactic IV antibiotic prior to c/s. Pt. Requesting to "have urine checked;" order received from [place], [place].  Asked patient to tell nurse if any other symptom of a UTI is experienced.  Will continue to monitor.  Vivi Martens RN

## 2014-06-03 NOTE — Lactation Note (Signed)
This note was copied from the chart of Kelsey Talar Fraley. Lactation Consultation Note  Patient Name: Kelsey Cox ZOXWR'U Date: 06/03/2014 Reason for consult: Follow-up assessment Per mom using a nipple shield for latching, and instilling formula into the nipple shield  With a syringe. Baby awake and showing feeding cues , LC checked diaper and placed baby skin to skin and placed baby  Cross cradle left breast. LC checked the size of the #20 Nipple shield and felt the size was boarder line tight , sized for #24  Nipple shield and the size fit better , but when the Baby ;latched the NS was to large for the baby's small mouth. And she was  not able to obtain consistent depth at the breast , switched back to a #20 NS with formula instilled in the top and the baby would  Latch but would be on and off and fell asleep. LC recommended to mom when the baby was more awake to call for feeding assessment. LC encouraged mom to increased her pumping after feedings 10 -15 mins both breast when the baby isn't cluster feeding.     Maternal Data Has patient been taught Hand Expression?: Yes  Feeding Feeding Type: Breast Fed Length of feed: 5 min (on and off latch and feeding pattern for 5-7 mins )  LATCH Score/Interventions Latch: Repeated attempts needed to sustain latch, nipple held in mouth throughout feeding, stimulation needed to elicit sucking reflex. (baby latched for a few sucks , on and off , fell asleep ) Intervention(s): Skin to skin;Teach feeding cues;Waking techniques Intervention(s): Adjust position;Assist with latch;Breast massage;Breast compression  Audible Swallowing: A few with stimulation  Type of Nipple: Everted at rest and after stimulation (short shaft )  Comfort (Breast/Nipple): Soft / non-tender     Hold (Positioning): Assistance needed to correctly position infant at breast and maintain latch. Intervention(s): Breastfeeding basics reviewed;Support  Pillows;Position options;Skin to skin  LATCH Score: 7  Lactation Tools Discussed/Used Tools: Nipple Shields Nipple shield size: 20;24;Other (comment) (#20 NS better fit for baby's mouth )   Consult Status Consult Status: Follow-up Date: 06/04/14 Follow-up type: In-patient    Kathrin Greathouse 06/03/2014, 4:34 PM

## 2014-06-04 MED ORDER — OXYCODONE-ACETAMINOPHEN 5-325 MG PO TABS: 1.0000 | ORAL_TABLET | ORAL | Status: DC | PRN

## 2014-06-04 MED ORDER — IBUPROFEN 600 MG PO TABS: 600.0000 mg | ORAL_TABLET | Freq: Four times a day (QID) | ORAL | Status: DC

## 2014-06-04 NOTE — Lactation Note (Signed)
This note was copied from the chart of Kelsey Veleka Djordjevic. Lactation Consultation Note  Mom been attaching baby to the breast with a NS.  Dad is using a curved tip syringe behind the NS to supplement the baby.  I recommended an SNS because volumes are increasing.  Parents were educated on use of.  Mom would like to continue to use their current "system".  I observed them feeding this way and the baby is not able to maintain a deep latch when using the current system.  I brought this to their attention and they verbalized understanding of SNS benefitsl.  Mom was able to express about 3 ml with her most recent pumping.  This was encouraging to her.  Outpatient appointment scheduled for Monday.  Patient Name: Kelsey Cox ZOXWR'U Date: 06/04/2014     Maternal Data    Feeding    LATCH Score/Interventions                      Lactation Tools Discussed/Used     Consult Status      Kelsey Cox 06/04/2014, 2:04 PM

## 2014-06-04 NOTE — Discharge Summary (Signed)
OBSTETRICAL DISCHARGE SUMMARY   Patient ID: Kelsey Cox MRN: [id number] DOB/AGE: 35-Apr-1982 35 y.o.  Admit date: 06/01/2014 Admission Diagnoses: SROM and breech presentation    Discharge date:  06/04/2014 Discharge Diagnoses: S/P Cesarean Delivery for Breech  Reason for Admission: SROM and breech presentation  Prenatal history: G3P1011   EDC : 06/13/2014, by Ultrasound  Prenatal care at [facility]  Primary provider : Dr. Billy Coast Prenatal course complicated by Abnormal GTT  Prenatal Labs: ABO, Rh:O POS (09/14 2130)  Antibody: NEG (09/14 2125) Rubella:  Immune RPR:  Non-Reactive HBsAg:  Non-Reactive  HIV:  Non-Reactive  GBS:  Abnormal   Labor Summary: Admitted with SROM and breech presentation / Primary cesarean delivery by Dr. Billy Coast - see Operative note  Anesthesia: spinal Procedures: cesarean delivery Complications: none  Newborn Data:  Gender: female Feeding method : breast Circumcision: N/A Weight: 5 lbs 11.9 oz Apgar: 9/9  Discharge Information: Condition: stable Activity: pelvic rest Diet: routine Medications: PNV, Ibuprofen and Percocet   Instructions: [facility] / instructions reviewed Discharge to: home Follow up : [facility] at 6 weeks postpartum  Signed: Raelyn Mora, Judie Petit MSN, CNM  06/04/2014, 9:31 AM

## 2014-06-04 NOTE — Progress Notes (Signed)
Patient ID: Kelsey Cox, female   DOB: 10-Apr-1981, 33 y.o.   MRN: [id number] Subjective: POD# 3 Information for the patient's newborn:  [place], Coffin [[id number]]  female   Reports feeling well Feeding: breast Patient reports tolerating PO.  Breast symptoms: none Pain controlled with ibuprofen (OTC) and narcotic analgesics including Percocet Denies HA/SOB/C/P/N/V/dizziness. Flatus present. She reports vaginal bleeding as normal, without clots.  She is ambulating, urinating without difficult.     Objective:   VS:  Filed Vitals:   06/02/14 1835 06/03/14 0636 06/03/14 1805 06/04/14 0700  BP: 112/61 97/56 119/71 97/50  Pulse: 56 55 70 48  Temp: 97.9 F (36.6 C) 98.3 F (36.8 C) 98.2 F (36.8 C) 98.4 F (36.9 C)  TempSrc: Oral Oral Oral Oral  Resp: Height:      Weight:      SpO2: 98% 99% 100% 98%       Recent Labs  06/01/14 2115 06/02/14 0500  WBC 13.2* 12.5*  HGB 13.3 11.7*  HCT 38.3 33.9*  PLT 146* 122*     Blood type: O POS (09/14 2130)  Rubella:  Immune     Physical Exam:  General: alert, cooperative and no distress Abdomen: soft, nontender, normal bowel sounds Incision: Tegaderm and Honeycomb dressing C/D/I - skin well-approximated with sutures Uterine Fundus: firm, 3 FB below umbilicus, nontender Lochia: minimal Ext: extremities normal, atraumatic, no cyanosis or edema and Homans sign is negative, no sign of DVT   Assessment/Plan: 33 y.o.   POD# 3.  s/p Cesarean Delivery.  Indications: breech                Principal Problem: Postpartum care following cesarean delivery (9/14)  Doing well, stable.               Regular diet as tolerated Ambulate Routine post-op care D/C Home today  Kenard Gower, MSN, CNM 06/04/2014, 9:25 AM

## 2014-06-04 NOTE — Discharge Instructions (Signed)
Breast Pumping Tips °If you are breastfeeding, there may be times when you cannot feed your baby directly. Returning to work or going on a trip are common examples. Pumping allows you to store breast milk and feed it to your baby later.  °You may not get much milk when you first start to pump. Your breasts should start to make more after a few days. If you pump at the times you usually feed your baby, you may be able to keep making enough milk to feed your baby without also using formula. The more often you pump, the more milk you will produce.  °WHEN SHOULD I PUMP?  °· You can begin to pump soon after delivery. However, some experts recommend waiting about 4 weeks before giving your infant a bottle to make sure breastfeeding is going well.  °· If you plan to return to work, begin pumping a few weeks before. This will help you develop techniques that work best for you. It also lets you build up a supply of breast milk.   °· When you are with your infant, feed on demand and pump after each feeding.   °· When you are away from your infant for several hours, pump for about 15 minutes every 2-3 hours. Pump both breasts at the same time if you can.   °· If your infant has a formula feeding, make sure to pump around the same time.     °· If you drink any alcohol, wait 2 hours before pumping.   °HOW DO I PREPARE TO PUMP? °Your let-down reflex is the natural reaction to stimulation that makes your breast milk flow. It is easier to stimulate this reflex when you are relaxed. Find relaxation techniques that work for you. If you have difficulty with your let-down reflex, try these methods:  °· Smell one of your infant's blankets or an item of clothing.   °· Look at a picture or video of your infant.   °· Sit in a quiet, private space.   °· Massage the breast you plan to pump.   °· Place soothing warmth on the breast.   °· Play relaxing music.   °WHAT ARE SOME GENERAL BREAST PUMPING TIPS? °· Wash your hands before you pump. You  do not need to wash your nipples or breasts. °· There are three ways to pump. °¨ You can use your hand to massage and compress your breast. °¨ You can use a handheld manual pump. °¨ You can use an electric pump.   °· Make sure the suction cup (flange) on the breast pump is the right size. Place the flange directly over the nipple. If it is the wrong size or placed the wrong way, it may be painful and cause nipple damage.   °· If pumping is uncomfortable, apply a small amount of purified or modified lanolin to your nipple and areola. °· If you are using an electric pump, adjust the speed and suction power to be more comfortable. °· If pumping is painful or if you are not getting very much milk, you may need a different type of pump. A lactation consultant can help you determine what type of pump to use.   °· Keep a full water bottle near you at all times. Drinking lots of fluid helps you make more milk.  °· You can store your milk to use later. Pumped breast milk can be stored in a sealable, sterile container or plastic bag. Label all stored breast milk with the date you pumped it. °¨ Milk can stay out at room temperature for up to 8 hours. °¨   You can store your milk in the refrigerator for up to 8 days. °¨ You can store your milk in the freezer for 3 months. Thaw frozen milk using warm water. Do not put it in the microwave. °· Do not smoke. Smoking can lower your milk supply and harm your infant. If you need help quitting, ask your health care provider to recommend a program.   °WHEN SHOULD I CALL MY HEALTH CARE PROVIDER OR A LACTATION CONSULTANT? °· You are having trouble pumping. °· You are concerned that you are not making enough milk. °· You have nipple pain, soreness, or redness. °· You want to use birth control. Birth control pills may lower your milk supply. Talk to your health care provider about your options. °Document Released: 02/22/2010 Document Revised: 09/09/2013 Document Reviewed:  06/27/2013 °Exit[employer]® Patient Information ©2015 [employer]. This information is not intended to replace advice given to you by your health care provider. Make sure you discuss any questions you have with your health care provider. ° °Nutrition for the New Mother  °A new mother needs good health and nutrition so she can have energy to take care of a new baby. Whether a mother breastfeeds or formula feeds the baby, it is important to have a well-balanced diet. Foods from all the food groups should be chosen to meet the new mother's energy needs and to give her the nutrients needed for repair and healing.  °A HEALTHY EATING PLAN °The My [employer] plan for Moms outlines what you should eat to help you and your baby stay healthy. The energy and amount of food you need depends on whether or not you are breastfeeding. If you are breastfeeding you will need more nutrients. If you choose not to breastfeed, your nutrition goal should be to return to a healthy weight. Limiting calories may be needed if you are not breastfeeding.  °HOME CARE INSTRUCTIONS  °· For a personal plan based on your unique needs, see your Registered Dietitian or visit www.mypyramid.gov. °· Eat a variety of foods. The plan below will help guide you. The following chart has a suggested daily meal plan from the [employer] for Moms. °· Eat a variety of fruits and vegetables. °· Eat more dark green and orange vegetables and cooked dried beans. °· Make half your grains whole grains. Choose whole instead of refined grains. °· Choose low-fat or lean meats and poultry. °· Choose low-fat or fat-free dairy products like milk, cheese, or yogurt. °Fruits °· Breastfeeding: 2 cups °· Non-Breastfeeding: 2 cups °· What Counts as a serving? °¨ 1 cup of fruit or juice. °¨ ½ cup dried fruit. °Vegetables °· Breastfeeding: 3 cups °· Non-Breastfeeding: 2 ½ cups °· What Counts as a serving? °¨ 1 cup raw or cooked vegetables. °¨ Juice or 2 cups raw leafy  vegetables. °Grains °· Breastfeeding: 8 oz °· Non-Breastfeeding: 6 oz °· What Counts as a serving? °¨ 1 slice bread. °¨ 1 oz ready-to-eat cereal. °¨ ½ cup cooked pasta, rice, or cereal. °Meat and Beans °· Breastfeeding: 6 ½ oz °· Non-Breastfeeding: 5 ½ oz °· What Counts as a serving? °¨ 1 oz lean meat, poultry, or fish °¨ ¼ cup cooked dry beans °¨ ½ oz nuts or 1 egg °¨ 1 tbs peanut butter °Milk °· Breastfeeding: 3 cups °· Non-Breastfeeding: 3 cups °· What Counts as a serving? °¨ 1 cup milk. °¨ 8 oz yogurt. °¨ 1 ½ oz cheese. °¨ 2 oz processed cheese. °TIPS FOR THE BREASTFEEDING MOM °· Rapid weight   loss is not suggested when you are breastfeeding. By simply breastfeeding, you will be able to lose the weight gained during your pregnancy. Your caregiver can keep track of your weight and tell you if your weight loss is appropriate. °· Be sure to drink fluids. You may notice that you are thirstier than usual. A suggestion is to drink a glass of water or other beverage whenever you breastfeed. °· Avoid alcohol as it can be passed into your breast milk. °· Limit caffeine drinks to no more than 2 to 3 cups per day. °· You may need to keep taking your prenatal vitamin while you are breastfeeding. Talk with your caregiver about taking a vitamin or supplement. °RETURING TO A HEALTHY WEIGHT °· The My [employer] Plan for Moms will help you return to a healthy weight. It will also provide the nutrients you need. °· You may need to limit "empty" calories. These include: °¨ High fat foods like fried foods, fatty meats, fast food, butter, and mayonnaise. °¨ High sugar foods like sodas, jelly, candy, and sweets. °· Be physically active. Include 30 minutes of exercise or more each day. Choose an activity you like such as walking, swimming, biking, or aerobics. Check with your caregiver before you start to exercise. °Document Released: 12/12/2007 Document Revised: 11/27/2011 Document Reviewed: 12/12/2007 °ExitCare® Patient Information  ©2015 [employer]. This information is not intended to replace advice given to you by your health care provider. Make sure you discuss any questions you have with your health care provider. °Postpartum Depression and Baby Blues °The postpartum period begins right after the birth of a baby. During this time, there is often a great amount of joy and excitement. It is also a time of many changes in the life of the parents. Regardless of how many times a mother gives birth, each child brings new challenges and dynamics to the family. It is not unusual to have feelings of excitement along with confusing shifts in moods, emotions, and thoughts. All mothers are at risk of developing postpartum depression or the "baby blues." These mood changes can occur right after giving birth, or they may occur many months after giving birth. The baby blues or postpartum depression can be mild or severe. Additionally, postpartum depression can go away rather quickly, or it can be a long-term condition.  °CAUSES °Raised hormone levels and the rapid drop in those levels are thought to be a main cause of postpartum depression and the baby blues. A number of hormones change during and after pregnancy. Estrogen and progesterone usually decrease right after the delivery of your baby. The levels of thyroid hormone and various cortisol steroids also rapidly drop. Other factors that play a role in these mood changes include major life events and genetics.  °RISK FACTORS °If you have any of the following risks for the baby blues or postpartum depression, know what symptoms to watch out for during the postpartum period. Risk factors that may increase the likelihood of getting the baby blues or postpartum depression include: °· Having a personal or family history of depression.   °· Having depression while being pregnant.   °· Having premenstrual mood issues or mood issues related to oral contraceptives. °· Having a lot of life stress.   °· Having  marital conflict.   °· Lacking a social support network.   °· Having a baby with special needs.   °· Having health problems, such as diabetes.   °SIGNS AND SYMPTOMS °Symptoms of baby blues include: °· Brief changes in mood, such as going   from extreme happiness to sadness. °· Decreased concentration.   °· Difficulty sleeping.   °· Crying spells, tearfulness.   °· Irritability.   °· Anxiety.   °Symptoms of postpartum depression typically begin within the first month after giving birth. These symptoms include: °· Difficulty sleeping or excessive sleepiness.   °· Marked weight loss.   °· Agitation.   °· Feelings of worthlessness.   °· Lack of interest in activity or food.   °Postpartum psychosis is a very serious condition and can be dangerous. Fortunately, it is rare. Displaying any of the following symptoms is cause for immediate medical attention. Symptoms of postpartum psychosis include:  °· Hallucinations and delusions.   °· Bizarre or disorganized behavior.   °· Confusion or disorientation.   °DIAGNOSIS  °A diagnosis is made by an evaluation of your symptoms. There are no medical or lab tests that lead to a diagnosis, but there are various questionnaires that a health care provider may use to identify those with the baby blues, postpartum depression, or psychosis. Often, a screening tool called the Edinburgh Postnatal Depression Scale is used to diagnose depression in the postpartum period.  °TREATMENT °The baby blues usually goes away on its own in 1-2 weeks. Social support is often all that is needed. You will be encouraged to get adequate sleep and rest. Occasionally, you may be given medicines to help you sleep.  °Postpartum depression requires treatment because it can last several months or longer if it is not treated. Treatment may include individual or group therapy, medicine, or both to address any social, physiological, and psychological factors that may play a role in the depression. Regular exercise, a  healthy diet, rest, and social support may also be strongly recommended.  °Postpartum psychosis is more serious and needs treatment right away. Hospitalization is often needed. °HOME CARE INSTRUCTIONS °· Get as much rest as you can. Nap when the baby sleeps.   °· Exercise regularly. Some women find yoga and walking to be beneficial.   °· Eat a balanced and nourishing diet.   °· Do little things that you enjoy. Have a cup of tea, take a bubble bath, read your favorite magazine, or listen to your favorite music. °· Avoid alcohol.   °· Ask for help with household chores, cooking, grocery shopping, or running errands as needed. Do not try to do everything.   °· Talk to people close to you about how you are feeling. Get support from your partner, family members, friends, or other new moms. °· Try to stay positive in how you think. Think about the things you are grateful for.   °· Do not spend a lot of time alone.   °· Only take over-the-counter or prescription medicine as directed by your health care provider. °· Keep all your postpartum appointments.   °· Let your health care provider know if you have any concerns.   °SEEK MEDICAL CARE IF: °You are having a reaction to or problems with your medicine. °SEEK IMMEDIATE MEDICAL CARE IF: °· You have suicidal feelings.   °· You think you may harm the baby or someone else. °MAKE SURE YOU: °· Understand these instructions. °· Will watch your condition. °· Will get help right away if you are not doing well or get worse. °Document Released: 06/08/2004 Document Revised: 09/09/2013 Document Reviewed: 06/16/2013 °Exit[employer]® Patient Information ©2015 [employer]. This information is not intended to replace advice given to you by your health care provider. Make sure you discuss any questions you have with your health care provider. °Breastfeeding and Mastitis °Mastitis is inflammation of the breast tissue. It can occur in women who   are breastfeeding. This can make breastfeeding  painful. Mastitis will sometimes go away on its own. Your health care provider will help determine if treatment is needed. °CAUSES °Mastitis is often associated with a blocked milk (lactiferous) duct. This can happen when too much milk builds up in the breast. Causes of excess milk in the breast can include: °· Poor latch-on. If your baby is not latched onto the breast properly, she or he may not empty your breast completely while breastfeeding. °· Allowing too much time to pass between feedings. °· Wearing a bra or other clothing that is too tight. This puts extra pressure on the lactiferous ducts so milk does not flow through them as it should. °Mastitis can also be caused by a bacterial infection. Bacteria may enter the breast tissue through cuts or openings in the skin. In women who are breastfeeding, this may occur because of cracked or irritated skin. Cracks in the skin are often caused when your baby does not latch on properly to the breast. °SIGNS AND SYMPTOMS °· Swelling, redness, tenderness, and pain in an area of the breast. °· Swelling of the glands under the arm on the same side. °· Fever may or may not accompany mastitis. °If an infection is allowed to progress, a collection of pus (abscess) may develop. °DIAGNOSIS  °Your health care provider can usually diagnose mastitis based on your symptoms and a physical exam. Tests may be done to help confirm the diagnosis. These may include: °· Removal of pus from the breast by applying pressure to the area. This pus can be examined in the lab to determine which bacteria are present. If an abscess has developed, the fluid in the abscess can be removed with a needle. This can also be used to confirm the diagnosis and determine the bacteria present. In most cases, pus will not be present. °· Blood tests to determine if your body is fighting a bacterial infection. °· Mammogram or ultrasound tests to rule out other problems or diseases. °TREATMENT  °Mastitis that  occurs with breastfeeding will sometimes go away on its own. Your health care provider may choose to wait 24 hours after first seeing you to decide whether a prescription medicine is needed. If your symptoms are worse after 24 hours, your health care provider will likely prescribe an antibiotic medicine to treat the mastitis. He or she will determine which bacteria are most likely causing the infection and will then select an appropriate antibiotic medicine. This is sometimes changed based on the results of tests performed to identify the bacteria, or if there is no response to the antibiotic medicine selected. Antibiotic medicines are usually given by mouth. You may also be given medicine for pain. °HOME CARE INSTRUCTIONS °· Only take over-the-counter or prescription medicines for pain, fever, or discomfort as directed by your health care provider. °· If your health care provider prescribed an antibiotic medicine, take the medicine as directed. Make sure you finish it even if you start to feel better. °· Do not wear a tight or underwire bra. Wear a soft, supportive bra. °· Increase your fluid intake, especially if you have a fever. °· Continue to empty the breast. Your health care provider can tell you whether this milk is safe for your infant or needs to be thrown out. You may be told to stop nursing until your health care provider thinks it is safe for your baby. Use a breast pump if you are advised to stop nursing. °· Keep your nipples   clean and dry. °· Empty the first breast completely before going to the other breast. If your baby is not emptying your breasts completely for some reason, use a breast pump to empty your breasts. °· If you go back to work, pump your breasts while at work to stay in time with your nursing schedule. °· Avoid allowing your breasts to become overly filled with milk (engorged). °SEEK MEDICAL CARE IF: °· You have pus-like discharge from the breast. °· Your symptoms do not improve with  the treatment prescribed by your health care provider within 2 days. °SEEK IMMEDIATE MEDICAL CARE IF: °· Your pain and swelling are getting worse. °· You have pain that is not controlled with medicine. °· You have a red line extending from the breast toward your armpit. °· You have a fever or persistent symptoms for more than 2-3 days. °· You have a fever and your symptoms suddenly get worse. °MAKE SURE YOU:  °· Understand these instructions. °· Will watch your condition. °· Will get help right away if you are not doing well or get worse. °Document Released: 12/30/2004 Document Revised: 09/09/2013 Document Reviewed: 04/10/2013 °[employer]® Patient Information ©2015 [employer]. This information is not intended to replace advice given to you by your health care provider. Make sure you discuss any questions you have with your health care provider. °Breastfeeding °Deciding to breastfeed is one of the best choices you can make for you and your baby. A change in hormones during pregnancy causes your breast tissue to grow and increases the number and size of your milk ducts. These hormones also allow proteins, sugars, and fats from your blood supply to make breast milk in your milk-producing glands. Hormones prevent breast milk from being released before your baby is born as well as prompt milk flow after birth. Once breastfeeding has begun, thoughts of your baby, as well as his or her sucking or crying, can stimulate the release of milk from your milk-producing glands.  °BENEFITS OF BREASTFEEDING °For Your Baby °· Your first milk (colostrum) helps your baby's digestive system function better.   °· There are antibodies in your milk that help your baby fight off infections.   °· Your baby has a lower incidence of asthma, allergies, and sudden infant death syndrome.   °· The nutrients in breast milk are better for your baby than infant formulas and are designed uniquely for your baby's needs.   °· Breast milk improves your  baby's brain development.   °· Your baby is less likely to develop other conditions, such as childhood obesity, asthma, or type 2 diabetes mellitus.   °For You  °· Breastfeeding helps to create a very special bond between you and your baby.   °· Breastfeeding is convenient. Breast milk is always available at the correct temperature and costs nothing.   °· Breastfeeding helps to burn calories and helps you lose the weight gained during pregnancy.   °· Breastfeeding makes your uterus contract to its prepregnancy size faster and slows bleeding (lochia) after you give birth.   °· Breastfeeding helps to lower your risk of developing type 2 diabetes mellitus, osteoporosis, and breast or ovarian cancer later in life. °SIGNS THAT YOUR BABY IS HUNGRY °Early Signs of Hunger  °· Increased alertness or activity. °· Stretching. °· Movement of the head from side to side. °· Movement of the head and opening of the mouth when the corner of the mouth or cheek is stroked (rooting). °· Increased sucking sounds, smacking lips, cooing, sighing, or squeaking. °· Hand-to-mouth movements. °· Increased sucking of   fingers or hands. °Late Signs of Hunger °· Fussing. °· Intermittent crying. °Extreme Signs of Hunger °Signs of extreme hunger will require calming and consoling before your baby will be able to breastfeed successfully. Do not wait for the following signs of extreme hunger to occur before you initiate breastfeeding:   °· Restlessness. °· A loud, strong cry. °·  Screaming. °BREASTFEEDING BASICS °Breastfeeding Initiation °· Find a comfortable place to sit or lie down, with your neck and back well supported. °· Place a pillow or rolled up blanket under your baby to bring him or her to the level of your breast (if you are seated). Nursing pillows are specially designed to help support your arms and your baby while you breastfeed. °· Make sure that your baby's abdomen is facing your abdomen.   °· Gently massage your breast. With your  fingertips, massage from your chest wall toward your nipple in a circular motion. This encourages milk flow. You may need to continue this action during the feeding if your milk flows slowly. °· Support your breast with 4 fingers underneath and your thumb above your nipple. Make sure your fingers are well away from your nipple and your baby's mouth.   °· Stroke your baby's lips gently with your finger or nipple.   °· When your baby's mouth is open wide enough, quickly bring your baby to your breast, placing your entire nipple and as much of the colored area around your nipple (areola) as possible into your baby's mouth.   °¨ More areola should be visible above your baby's upper lip than below the lower lip.   °¨ Your baby's tongue should be between his or her lower gum and your breast.   °· Ensure that your baby's mouth is correctly positioned around your nipple (latched). Your baby's lips should create a seal on your breast and be turned out (everted). °· It is common for your baby to suck about 2-3 minutes in order to start the flow of breast milk. °Latching °Teaching your baby how to latch on to your breast properly is very important. An improper latch can cause nipple pain and decreased milk supply for you and poor weight gain in your baby. Also, if your baby is not latched onto your nipple properly, he or she may swallow some air during feeding. This can make your baby fussy. Burping your baby when you switch breasts during the feeding can help to get rid of the air. However, teaching your baby to latch on properly is still the best way to prevent fussiness from swallowing air while breastfeeding. °Signs that your baby has successfully latched on to your nipple:    °· Silent tugging or silent sucking, without causing you pain.   °· Swallowing heard between every 3-4 sucks.   °·  Muscle movement above and in front of his or her ears while sucking.   °Signs that your baby has not successfully latched on to  nipple:  °· Sucking sounds or smacking sounds from your baby while breastfeeding. °· Nipple pain. °If you think your baby has not latched on correctly, slip your finger into the corner of your baby's mouth to break the suction and place it between your baby's gums. Attempt breastfeeding initiation again. °Signs of Successful Breastfeeding °Signs from your baby:   °· A gradual decrease in the number of sucks or complete cessation of sucking.   °· Falling asleep.   °· Relaxation of his or her body.   °· Retention of a small amount of milk in his or her mouth.   °· Letting go   of your breast by himself or herself. °Signs from you: °· Breasts that have increased in firmness, weight, and size 1-3 hours after feeding.   °· Breasts that are softer immediately after breastfeeding. °· Increased milk volume, as well as a change in milk consistency and color by the fifth day of breastfeeding.   °· Nipples that are not sore, cracked, or bleeding. °Signs That Your Baby is Getting Enough Milk °· Wetting at least 3 diapers in a 24-hour period. The urine should be clear and pale yellow by age 5 days. °· At least 3 stools in a 24-hour period by age 5 days. The stool should be soft and yellow. °· At least 3 stools in a 24-hour period by age 7 days. The stool should be seedy and yellow. °· No loss of weight greater than 10% of birth weight during the first 3 days of age. °· Average weight gain of 4-7 ounces (113-198 g) per week after age 4 days. °· Consistent daily weight gain by age 5 days, without weight loss after the age of 2 weeks. °After a feeding, your baby may spit up a small amount. This is common. °BREASTFEEDING FREQUENCY AND DURATION °Frequent feeding will help you make more milk and can prevent sore nipples and breast engorgement. Breastfeed when you feel the need to reduce the fullness of your breasts or when your baby shows signs of hunger. This is called "breastfeeding on demand." Avoid introducing a pacifier to your  baby while you are working to establish breastfeeding (the first 4-6 weeks after your baby is born). After this time you may choose to use a pacifier. Research has shown that pacifier use during the first year of a baby's life decreases the risk of sudden infant death syndrome (SIDS). °Allow your baby to feed on each breast as long as he or she wants. Breastfeed until your baby is finished feeding. When your baby unlatches or falls asleep while feeding from the first breast, offer the second breast. Because newborns are often sleepy in the first few weeks of life, you may need to awaken your baby to get him or her to feed. °Breastfeeding times will vary from baby to baby. However, the following rules can serve as a guide to help you ensure that your baby is properly fed: °· Newborns (babies 4 weeks of age or younger) may breastfeed every 1-3 hours. °· Newborns should not go longer than 3 hours during the day or 5 hours during the night without breastfeeding. °· You should breastfeed your baby a minimum of 8 times in a 24-hour period until you begin to introduce solid foods to your baby at around 6 months of age. °BREAST MILK PUMPING °Pumping and storing breast milk allows you to ensure that your baby is exclusively fed your breast milk, even at times when you are unable to breastfeed. This is especially important if you are going back to work while you are still breastfeeding or when you are not able to be present during feedings. Your lactation consultant can give you guidelines on how long it is safe to store breast milk.  °A breast pump is a machine that allows you to pump milk from your breast into a sterile bottle. The pumped breast milk can then be stored in a refrigerator or freezer. Some breast pumps are operated by hand, while others use electricity. Ask your lactation consultant which type will work best for you. Breast pumps can be purchased, but some hospitals and breastfeeding support groups   lease  breast pumps on a monthly basis. A lactation consultant can teach you how to hand express breast milk, if you prefer not to use a pump.  °CARING FOR YOUR BREASTS WHILE YOU BREASTFEED °Nipples can become dry, cracked, and sore while breastfeeding. The following recommendations can help keep your breasts moisturized and healthy: °· Avoid using soap on your nipples.   °· Wear a supportive bra. Although not required, special nursing bras and tank tops are designed to allow access to your breasts for breastfeeding without taking off your entire bra or top. Avoid wearing underwire-style bras or extremely tight bras. °· Air dry your nipples for 3-4 minutes after each feeding.   °· Use only cotton bra pads to absorb leaked breast milk. Leaking of breast milk between feedings is normal.   °· Use lanolin on your nipples after breastfeeding. Lanolin helps to maintain your skin's normal moisture barrier. If you use pure lanolin, you do not need to wash it off before feeding your baby again. Pure lanolin is not toxic to your baby. You may also hand express a few drops of breast milk and gently massage that milk into your nipples and allow the milk to air dry. °In the first few weeks after giving birth, some women experience extremely full breasts (engorgement). Engorgement can make your breasts feel heavy, warm, and tender to the touch. Engorgement peaks within 3-5 days after you give birth. The following recommendations can help ease engorgement: °· Completely empty your breasts while breastfeeding or pumping. You may want to start by applying warm, moist heat (in the shower or with warm water-soaked hand towels) just before feeding or pumping. This increases circulation and helps the milk flow. If your baby does not completely empty your breasts while breastfeeding, pump any extra milk after he or she is finished. °· Wear a snug bra (nursing or regular) or tank top for 1-2 days to signal your body to slightly decrease milk  production. °· Apply ice packs to your breasts, unless this is too uncomfortable for you. °· Make sure that your baby is latched on and positioned properly while breastfeeding. °If engorgement persists after 48 hours of following these recommendations, contact your health care provider or a lactation consultant. °OVERALL HEALTH CARE RECOMMENDATIONS WHILE BREASTFEEDING °· Eat healthy foods. Alternate between meals and snacks, eating 3 of each per day. Because what you eat affects your breast milk, some of the foods may make your baby more irritable than usual. Avoid eating these foods if you are sure that they are negatively affecting your baby. °· Drink milk, fruit juice, and water to satisfy your thirst (about 10 glasses a day).   °· Rest often, relax, and continue to take your prenatal vitamins to prevent fatigue, stress, and anemia. °· Continue breast self-awareness checks. °· Avoid chewing and smoking tobacco. °· Avoid alcohol and drug use. °Some medicines that may be harmful to your baby can pass through breast milk. It is important to ask your health care provider before taking any medicine, including all over-the-counter and prescription medicine as well as vitamin and herbal supplements. °It is possible to become pregnant while breastfeeding. If birth control is desired, ask your health care provider about options that will be safe for your baby. °SEEK MEDICAL CARE IF:  °· You feel like you want to stop breastfeeding or have become frustrated with breastfeeding. °· You have painful breasts or nipples. °· Your nipples are cracked or bleeding. °· Your breasts are red, tender, or warm. °· You have   a swollen area on either breast. °· You have a fever or chills. °· You have nausea or vomiting. °· You have drainage other than breast milk from your nipples. °· Your breasts do not become full before feedings by the fifth day after you give birth. °· You feel sad and depressed. °· Your baby is too sleepy to eat  well. °· Your baby is having trouble sleeping.   °· Your baby is wetting less than 3 diapers in a 24-hour period. °· Your baby has less than 3 stools in a 24-hour period. °· Your baby's skin or the white part of his or her eyes becomes yellow.   °· Your baby is not gaining weight by 5 days of age. °SEEK IMMEDIATE MEDICAL CARE IF:  °· Your baby is overly tired (lethargic) and does not want to wake up and feed. °· Your baby develops an unexplained fever. °Document Released: 09/04/2005 Document Revised: 09/09/2013 Document Reviewed: 02/26/2013 °[employer]® Patient Information ©2015 [employer]. This information is not intended to replace advice given to you by your health care provider. Make sure you discuss any questions you have with your health care provider. ° °

## 2014-06-05 ENCOUNTER — Inpatient Hospital Stay (HOSPITAL_COMMUNITY): Admission: RE | Admit: 2014-06-05 | Payer: BC Managed Care – PPO | Source: Ambulatory Visit

## 2014-06-08 ENCOUNTER — Ambulatory Visit (HOSPITAL_COMMUNITY)
Admit: 2014-06-08 | Discharge: 2014-06-08 | Disposition: A | Discharge status: Home / Self Care | Payer: BC Managed Care – PPO | Attending: Obstetrics and Gynecology | Admitting: Obstetrics and Gynecology

## 2014-06-08 ENCOUNTER — Inpatient Hospital Stay (HOSPITAL_COMMUNITY)
Admission: AD | Admit: 2014-06-08 | Payer: BC Managed Care – PPO | Source: Ambulatory Visit | Admitting: Obstetrics and Gynecology

## 2014-06-08 SURGERY — Surgical Case
Anesthesia: Spinal

## 2014-06-08 NOTE — Lactation Note (Signed)
Lactation Consult  Mother's reason for visit: latching w/nipple shield  Consult:  Initial Lactation Consultant:  Remigio Eisenmenger, RN, IBCLC  ________________________________________________________________________ BW: [phone] (5# 11.9oz) 9-18: 5# 6 oz 9-21: 5# 8.9oz (today's weight) 3% below BW ____________________________________________________________________  Mother's Name: Kelsey Cox Type of delivery:  C/S  Breastfeeding Experience:  primip Maternal Medical Conditions:  Infertility, but conceived naturally.  Euthyroid goiter Maternal Medications: PNV, Motrin, Colace   ________________________________________________________________________  Breastfeeding History (Post Discharge)  Frequency of breastfeeding: q2-2.5 hours Duration of feeding: 10 min (if supplementing)     Pumping  Type of pump:  Medela Freestyle Frequency:  After q feed during the day; after qo feed at night Volume:1.5oz/session  Infant Intake and Output Assessment  Voids:  multiple in 24 hrs.  Color:  Clear yellow Stools: 3-4 in 24 hrs.  Color:  Yellow  ________________________________________________________________________  Maternal Breast Assessment  Breast:  Compressible Nipple:  Erect  ______________________________________________________________________ Feeding Assessment/Evaluation  Initial feeding assessment:  Infant's oral assessment:  WNL  Attached assessment:  Deep  Lips flanged:  Yes.      Tools:  Nipple shield 20 mm (on R side; 24mm on L side) Instructed on use and cleaning of tool:  Yes.    Pre-feed weight: 2520 g  (5 lb. 8.9 oz.) Post-feed weight: 2532 g Amount transferred: 12 ml  Pre-feed weight: 2532 g   Post-feed weight: 2534 g  Amount transferred: 2 ml  Pre-feed weight:  2508 g  (after having stooled & had diaper change) Post-feed weight:  2510 g  Amount transferred: 2 ml     Baby was then supplemented at breast w/curved-tip syringe (about  ). An additional pre- & post-weight was not done.   Total amount transferred: 16 ml Total supplement given: about 20 ml  Parents are doing an excellent job of supplementing baby at breast (w/curved-tip syringe).  Baby is only 3% below BW & baby has gained almost 3 oz over the last 3 days.   Mom uses a size 20 NS on her R side & a 24 on her L.  Baby is now able to accommodate the size 24. Parents chose not to use the SNS provided at discharge b/c they were pleased w/how well the baby was feeding feeding w/the curved-tip syringe.  Extra curved-tip syringes provided.   Mom's L nipple has some slight bruising at the tip. Mom does experience some discomfort w/pumping. Mom encouraged to try the size 27 flange on her L side.   [facility] to visit on Thursday.  A f/u appt w/Lactation is Tues, Sept 29th at 1430 to see if baby has improved ability to be at breast without supplement.

## 2014-06-16 ENCOUNTER — Ambulatory Visit (HOSPITAL_COMMUNITY)
Admission: RE | Admit: 2014-06-16 | Discharge: 2014-06-16 | Disposition: A | Discharge status: Home / Self Care | Payer: BC Managed Care – PPO | Source: Ambulatory Visit | Attending: Obstetrics and Gynecology | Admitting: Obstetrics and Gynecology

## 2014-06-16 NOTE — Lactation Note (Signed)
Lactation Consult  Mother's reason for visit:  To ensure mom and baby are progressing with breastfeeding  Visit Type:  feeding assessment  Appointment Notes:  Weight check  Consult:  Follow-Up Lactation Consultant:  Kathrin Greathouse  ________________________________________________________________________  Joan Flores Name: Kelsey Cox  Date of Birth: 06/01/2016  Pediatrician: Dr. Victorino Dike Summer  Gender: female  Gestational Age: [redacted]w[redacted]d (At Birth)  Birth Weight: 5 lb 11.9 oz (2605 g)  Weight at Discharge: Weight: 5 lb 4.5 oz (2395 g) Date of Discharge: 06/04/2014  [facility] Weights   06/01/14 2230 06/02/14 2330 06/04/14 0408  Weight: 5 lb 11.9 oz (2605 g) 5 lb 7 oz (2466 g) 5 lb 4.5 oz (2395 g)  Last weight taken from location outside of [facility]: 5-10 oz  Location: 9/21 at [facility] O/p  Weight today: 6-1.5 oz    ________________________________________________________________________  Mother's Name: Orlean Bradford Type of delivery:  C/section  Breastfeeding Experience:   Maternal Medical Conditions:  None  Maternal Medications:  PNV   ________________________________________________________________________  Breastfeeding History (Post Discharge)- per mom breast feeding is getting better ,pumping, breast feeding and supplementing with formula   Frequency of breastfeeding:  Every 2.5 to 4 hours  Duration of feeding:  10 -30 mins   Supplementing - with EBM or formula with a syringe , instilling EBM or formula behind the Nipple shield up to 60 ml after the Baby breast feeds. x3 a day . If using formula - Similac 22 calorie with a syringe Pumping with a DEBP - both breast after 5-6 feedings a day for 10 -15 mins - with 45 - 3 oz yield   Infant Intake and Output Assessment  Voids:  8  in 24 hrs.  Color:  Clear yellow Stools:  6 in 24 hrs.  Color:  Yellow  ________________________________________________________________________  Maternal Breast Assessment  Breast:   Full Nipple:  Erect Pain level:  0 Pain interventions:  Expressed breast milk  _______________________________________________________________________ Feeding Assessment/Evaluation  Initial feeding assessment: baby alert  And fussy and acting hungry , color pink , happy and content after feeding   Infant's oral assessment:  WNL  Positioning:  Cross cradle Right breast  LATCH documentation:  Latch:  2 = Grasps breast easily, tongue down, lips flanged, rhythmical sucking.  Audible swallowing:  2 = Spontaneous and intermittent  Type of nipple:  2 = Everted at rest and after stimulation  Comfort (Breast/Nipple):  1 = Filling, red/small blisters or bruises, mild/mod discomfort( Full , no breakdown )   Hold (Positioning):  2 = No assistance needed to correctly position infant at breast  LATCH score:  9   Attached assessment:  Shallow ( worked with mom on breast compressions after the baby got latched. , increased swallows noted   Lips flanged:  Yes.    Lips untucked:  Yes.    Suck assessment:  Nutritive  Tools:  Nipple shield 20 mm Instructed on use and cleaning of tool:  No.( mom aware )   Pre-feed weight:  2764 g  (6 lb. 1.5  oz.) Post-feed weight:  2794  g (6  lb. 2.5  oz.) Amount transferred:  30  ml Amount supplemented:  None needed  Additional Feeding Assessment -  Large wet changed and baby reweigh  Infant's oral assessment:  WNL  Positioning:  Cross cradle Left breast  LATCH documentation:  Latch:  2 = Grasps breast easily, tongue down, lips flanged, rhythmical sucking.  Audible swallowing:  2 = Spontaneous and intermittent  Type of nipple:  2 = Everted at rest and after stimulation  Comfort (Breast/Nipple):  1 = Filling, red/small blisters or bruises, mild/mod discomfort ( Full , no breakdown )   Hold (Positioning):  2 = No assistance needed to correctly position infant at breast  LATCH score: 9   Attached assessment:  Shallow ( assisted mom with depth with  breast compressions after the baby was latched , increased swallows noted   Lips flanged:  Yes.    Lips untucked:  Yes.    Suck assessment:  Nutritive  Tools:  Nipple shield 24 mm Instructed on use and cleaning of tool:  No. ( mom aware )   Pre-feed weight: 2778  g  (6 lb. 2 oz.) Post-feed weight:  2810  g  Amount transferred:  32 ml Amount supplemented:  None    Total amount pumped post feed: no pumping needed   Total amount transferred: 62  ml Total supplement given:  None   Lactation Plan of Care - Praised mom for her efforts breast feeding , using a nipple shield and extra pumping                                          - Per mom F/U with Dr. Victorino Dike Summer on 11/16 for Baby                                          - Encouraged attending the Breast feeding support group weekly , especially since dad is returning back to work                                          - Breast feeding Goal - is to continue to increase Baby's weight                                          - Review of steps for latching - stressing to mom not allow baby to latch herself on and to use breast compressions with latch for depth and flow                                         - Nipple shield use , on the right once the baby suck is stronger , if the nipple is being pulled up into the NS , increase size to #24 NS                                          - Transitioning baby off of the Nipple shield - it will be gradual, feed 1st breast with the NS , and try feeding 2nd breast without.                                         -  Discussed with mom due to baby taking off 62 ml off the breast at the consult , LC felt mom can come down from supplementing , and re-latch if still hungry.                                          - Also discussed instead of using the syringe - use a medium nipple ( same texture as a Nipple shield )                                          - using a Nipple shield requires extra  pumping to protect milk supply

## 2014-06-23 ENCOUNTER — Encounter (HOSPITAL_COMMUNITY): Payer: Self-pay | Admitting: *Deleted

## 2014-06-25 ENCOUNTER — Encounter (HOSPITAL_COMMUNITY): Payer: Self-pay | Admitting: Obstetrics and Gynecology

## 2014-07-20 ENCOUNTER — Encounter (HOSPITAL_COMMUNITY): Payer: Self-pay | Admitting: Obstetrics and Gynecology

## 2014-10-09 ENCOUNTER — Ambulatory Visit: Payer: BC Managed Care – PPO | Admitting: Obstetrics & Gynecology

## 2014-11-24 ENCOUNTER — Ambulatory Visit: Payer: BLUE CROSS/BLUE SHIELD

## 2016-01-29 DIAGNOSIS — J040 Acute laryngitis: Secondary | ICD-10-CM | POA: Diagnosis not present

## 2016-03-01 DIAGNOSIS — R634 Abnormal weight loss: Secondary | ICD-10-CM | POA: Diagnosis not present

## 2016-03-01 DIAGNOSIS — J45909 Unspecified asthma, uncomplicated: Secondary | ICD-10-CM | POA: Diagnosis not present

## 2016-04-14 DIAGNOSIS — F4322 Adjustment disorder with anxiety: Secondary | ICD-10-CM | POA: Diagnosis not present

## 2016-05-01 DIAGNOSIS — F4322 Adjustment disorder with anxiety: Secondary | ICD-10-CM | POA: Diagnosis not present

## 2016-05-12 DIAGNOSIS — F4322 Adjustment disorder with anxiety: Secondary | ICD-10-CM | POA: Diagnosis not present

## 2016-06-08 DIAGNOSIS — F4322 Adjustment disorder with anxiety: Secondary | ICD-10-CM | POA: Diagnosis not present

## 2016-06-16 DIAGNOSIS — F4322 Adjustment disorder with anxiety: Secondary | ICD-10-CM | POA: Diagnosis not present

## 2016-06-22 DIAGNOSIS — F322 Major depressive disorder, single episode, severe without psychotic features: Secondary | ICD-10-CM | POA: Diagnosis not present

## 2016-07-06 DIAGNOSIS — F4322 Adjustment disorder with anxiety: Secondary | ICD-10-CM | POA: Diagnosis not present

## 2016-07-21 DIAGNOSIS — F4322 Adjustment disorder with anxiety: Secondary | ICD-10-CM | POA: Diagnosis not present

## 2016-07-27 DIAGNOSIS — Z681 Body mass index (BMI) 19 or less, adult: Secondary | ICD-10-CM | POA: Diagnosis not present

## 2016-07-27 DIAGNOSIS — Z01419 Encounter for gynecological examination (general) (routine) without abnormal findings: Secondary | ICD-10-CM | POA: Diagnosis not present

## 2016-07-27 DIAGNOSIS — E079 Disorder of thyroid, unspecified: Secondary | ICD-10-CM | POA: Diagnosis not present

## 2016-08-04 DIAGNOSIS — F4322 Adjustment disorder with anxiety: Secondary | ICD-10-CM | POA: Diagnosis not present

## 2016-08-31 DIAGNOSIS — J22 Unspecified acute lower respiratory infection: Secondary | ICD-10-CM | POA: Diagnosis not present

## 2016-08-31 DIAGNOSIS — J019 Acute sinusitis, unspecified: Secondary | ICD-10-CM | POA: Diagnosis not present

## 2016-08-31 DIAGNOSIS — B9689 Other specified bacterial agents as the cause of diseases classified elsewhere: Secondary | ICD-10-CM | POA: Diagnosis not present

## 2016-09-01 DIAGNOSIS — F4322 Adjustment disorder with anxiety: Secondary | ICD-10-CM | POA: Diagnosis not present

## 2016-09-15 DIAGNOSIS — F4322 Adjustment disorder with anxiety: Secondary | ICD-10-CM | POA: Diagnosis not present

## 2016-10-13 DIAGNOSIS — F4322 Adjustment disorder with anxiety: Secondary | ICD-10-CM | POA: Diagnosis not present

## 2016-10-26 DIAGNOSIS — R634 Abnormal weight loss: Secondary | ICD-10-CM | POA: Diagnosis not present

## 2016-10-26 DIAGNOSIS — E049 Nontoxic goiter, unspecified: Secondary | ICD-10-CM | POA: Diagnosis not present

## 2016-10-26 DIAGNOSIS — F4322 Adjustment disorder with anxiety: Secondary | ICD-10-CM | POA: Diagnosis not present

## 2016-11-09 DIAGNOSIS — F4322 Adjustment disorder with anxiety: Secondary | ICD-10-CM | POA: Diagnosis not present

## 2017-01-15 ENCOUNTER — Other Ambulatory Visit: Payer: Self-pay | Admitting: Physician Assistant

## 2017-01-15 ENCOUNTER — Ambulatory Visit
Admission: RE | Admit: 2017-01-15 | Discharge: 2017-01-15 | Disposition: A | Discharge status: Home / Self Care | Payer: Self-pay | Source: Ambulatory Visit | Attending: Physician Assistant | Admitting: Physician Assistant

## 2017-01-15 DIAGNOSIS — R51 Headache: Secondary | ICD-10-CM | POA: Diagnosis not present

## 2017-01-15 DIAGNOSIS — R519 Headache, unspecified: Secondary | ICD-10-CM

## 2017-01-15 DIAGNOSIS — H539 Unspecified visual disturbance: Secondary | ICD-10-CM | POA: Diagnosis not present

## 2017-02-04 DIAGNOSIS — W57XXXA Bitten or stung by nonvenomous insect and other nonvenomous arthropods, initial encounter: Secondary | ICD-10-CM | POA: Diagnosis not present

## 2017-02-04 DIAGNOSIS — L03119 Cellulitis of unspecified part of limb: Secondary | ICD-10-CM | POA: Diagnosis not present

## 2017-02-07 DIAGNOSIS — L259 Unspecified contact dermatitis, unspecified cause: Secondary | ICD-10-CM | POA: Diagnosis not present

## 2017-04-19 DIAGNOSIS — L309 Dermatitis, unspecified: Secondary | ICD-10-CM | POA: Diagnosis not present

## 2017-04-19 DIAGNOSIS — G43909 Migraine, unspecified, not intractable, without status migrainosus: Secondary | ICD-10-CM | POA: Diagnosis not present

## 2017-04-19 DIAGNOSIS — F3281 Premenstrual dysphoric disorder: Secondary | ICD-10-CM | POA: Diagnosis not present

## 2017-04-19 DIAGNOSIS — L409 Psoriasis, unspecified: Secondary | ICD-10-CM | POA: Diagnosis not present

## 2017-05-24 DIAGNOSIS — F4322 Adjustment disorder with anxiety: Secondary | ICD-10-CM | POA: Diagnosis not present

## 2017-06-14 DIAGNOSIS — F4322 Adjustment disorder with anxiety: Secondary | ICD-10-CM | POA: Diagnosis not present

## 2017-07-29 DIAGNOSIS — Z23 Encounter for immunization: Secondary | ICD-10-CM | POA: Diagnosis not present

## 2017-09-07 DIAGNOSIS — Z1151 Encounter for screening for human papillomavirus (HPV): Secondary | ICD-10-CM | POA: Diagnosis not present

## 2017-09-07 DIAGNOSIS — Z681 Body mass index (BMI) 19 or less, adult: Secondary | ICD-10-CM | POA: Diagnosis not present

## 2017-09-07 DIAGNOSIS — Z01419 Encounter for gynecological examination (general) (routine) without abnormal findings: Secondary | ICD-10-CM | POA: Diagnosis not present

## 2017-09-16 IMAGING — CT CT HEAD W/O CM
1 series · 16 of 30 positions shown, 20 images · non-contrast
Comparison: None.

CLINICAL DATA: Headaches, blurred vision.

EXAM:
CT HEAD WITHOUT CONTRAST
TECHNIQUE: Contiguous axial images were obtained from the base of the skull
through the vertex without intravenous contrast.

[Series 2: head w/(date) · axial · 0.42mm/px · z∈[+1181,+1321]mm · 16 of 32 slices shown, 20 images]
[im 2/32  brain]
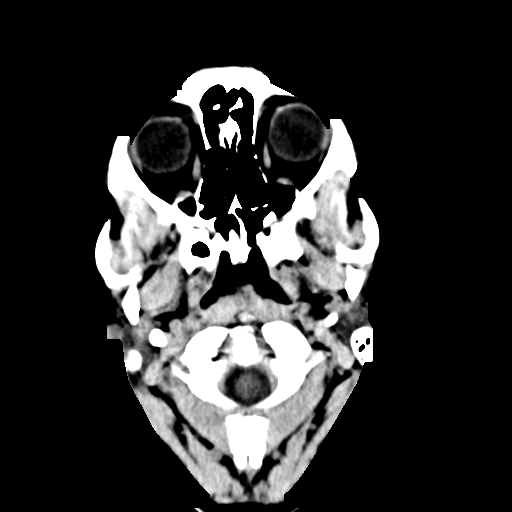
[im 2/32  bone]
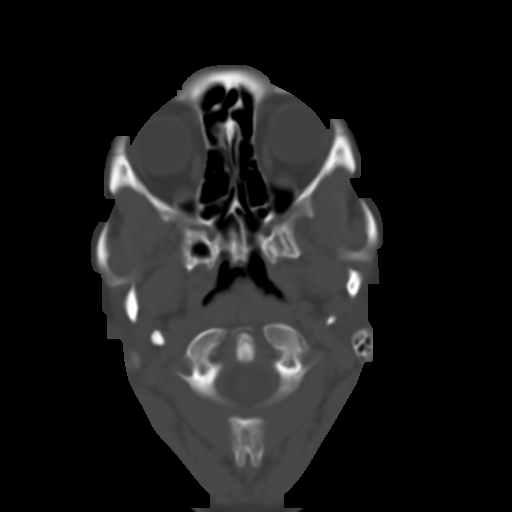
[im 4/32  brain]
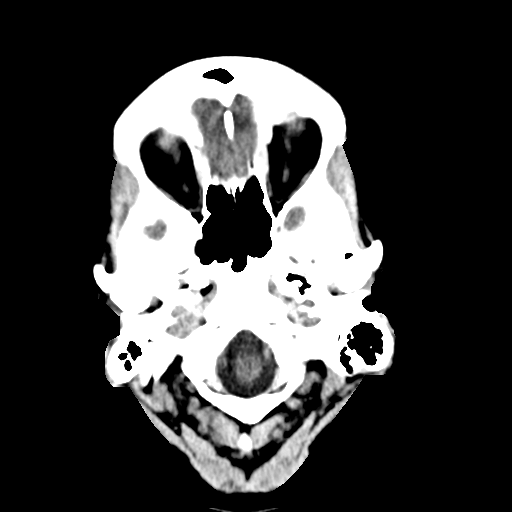
[im 6/32  brain]
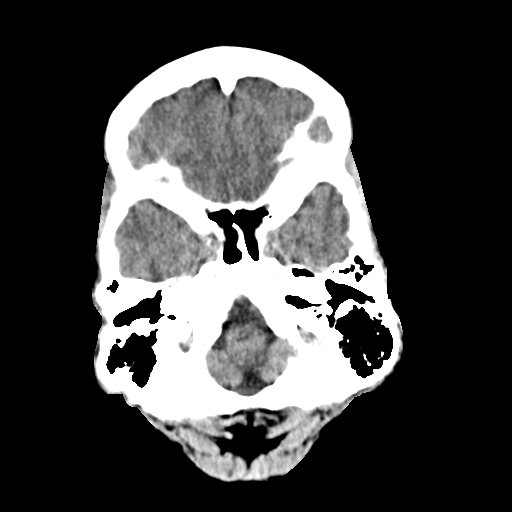
[im 8/32  brain]
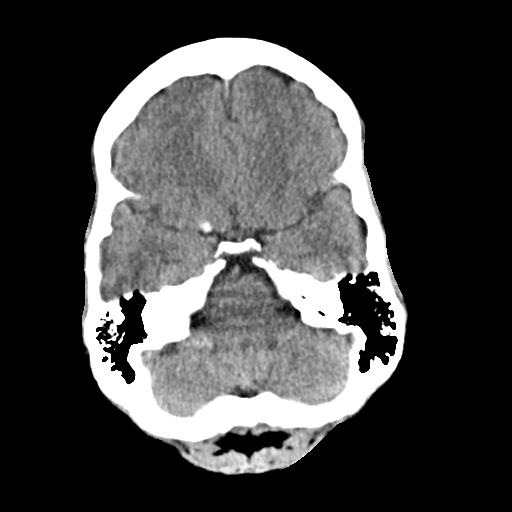
[im 9/32  brain]
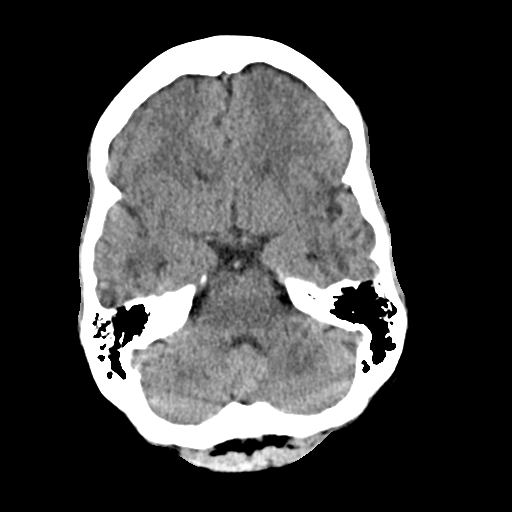
[im 9/32  bone]
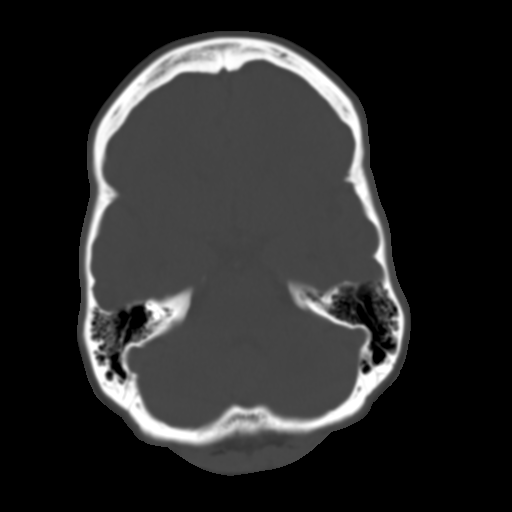
[im 11/32  brain]
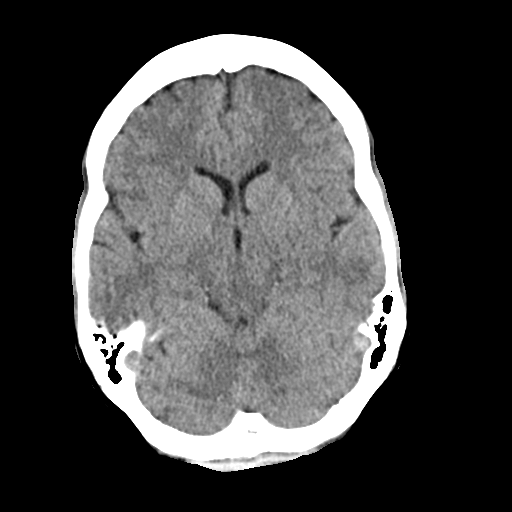
[im 13/32  brain]
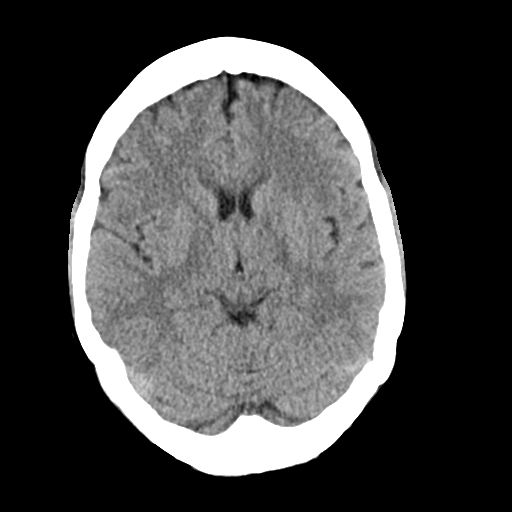
[im 15/32  brain]
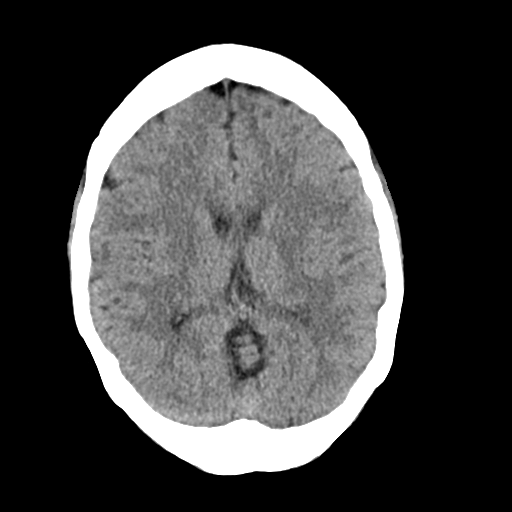
[im 17/32  brain]
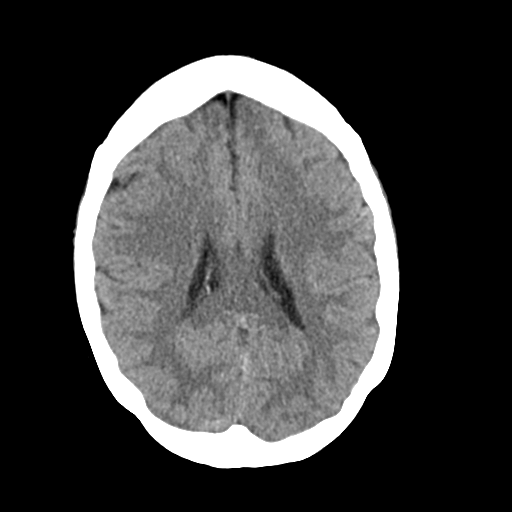
[im 17/32  bone]
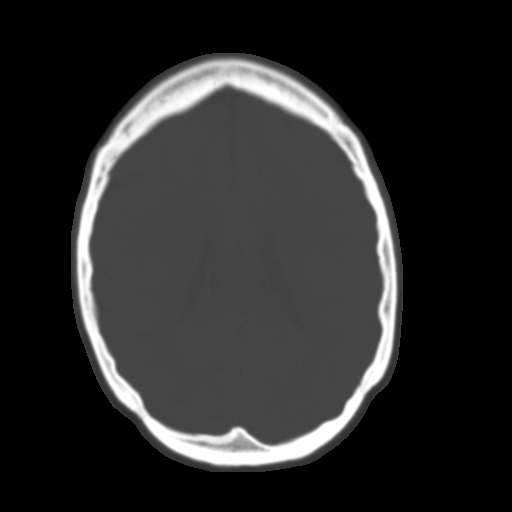
[im 19/32  brain]
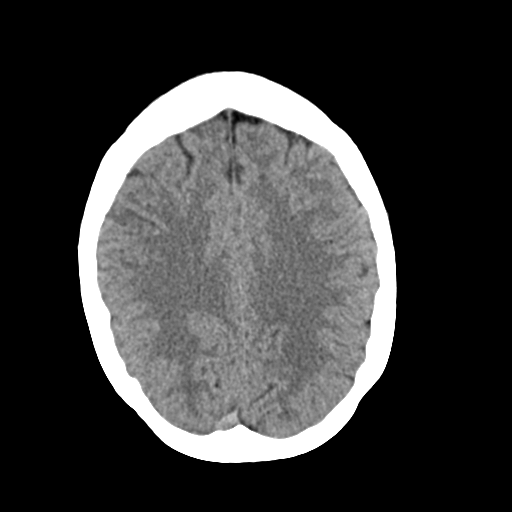
[im 21/32  brain]
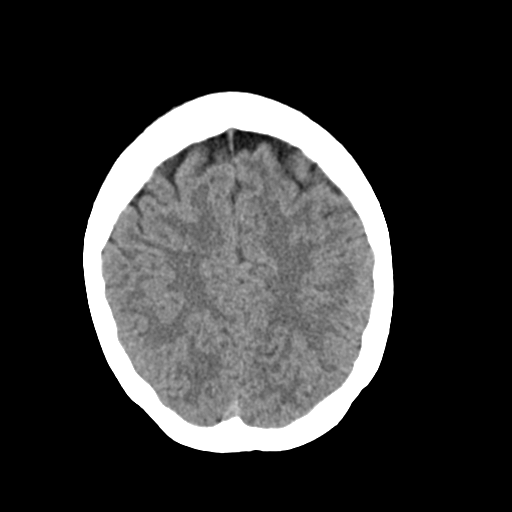
[im 23/32  brain]
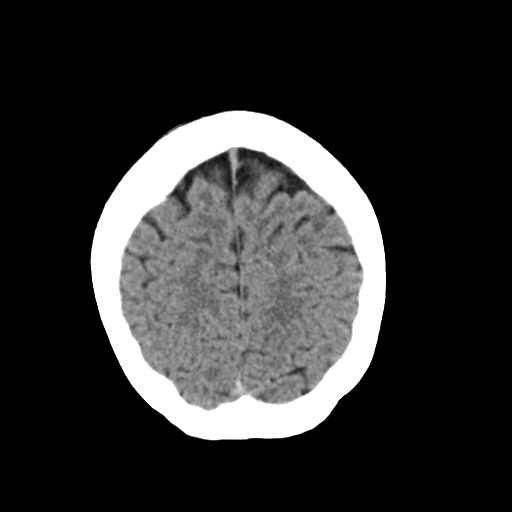
[im 24/32  brain]
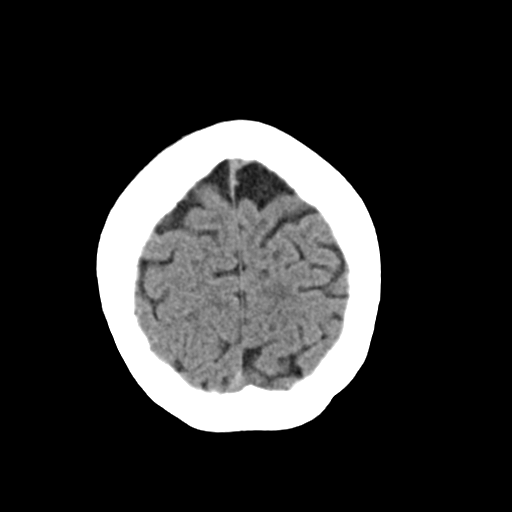
[im 24/32  bone]
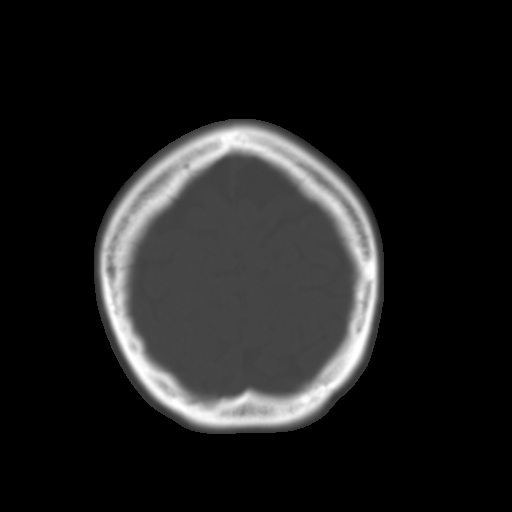
[im 26/32  brain]
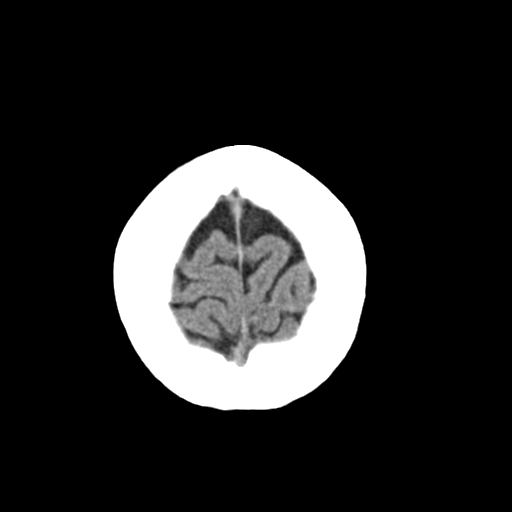
[im 28/32  brain]
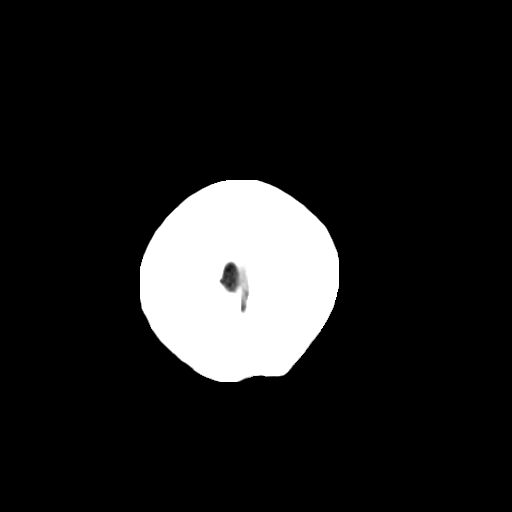
[im 30/32  brain]
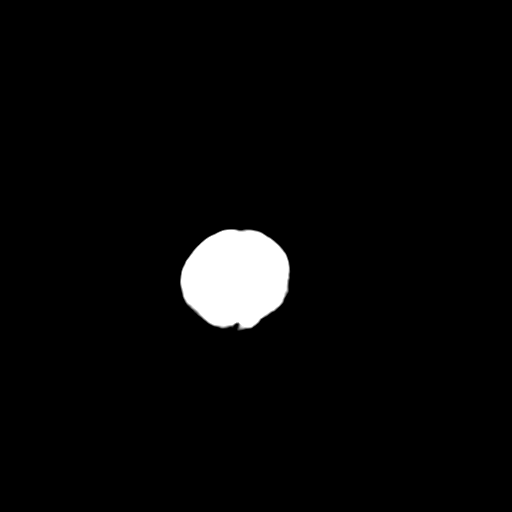

[16 of 30 positions shown; findings below may reference images not displayed]

FINDINGS: Brain: No evidence of acute infarction, hemorrhage, hydrocephalus,
extra-axial collection or mass lesion/mass effect.

Vascular: No hyperdense vessel or unexpected calcification.

Skull: Normal. Negative for fracture or focal lesion.

Sinuses/Orbits: No acute finding.

Other: None.
IMPRESSION: Normal head CT.

## 2017-11-02 DIAGNOSIS — F4322 Adjustment disorder with anxiety: Secondary | ICD-10-CM | POA: Diagnosis not present

## 2017-11-09 DIAGNOSIS — F4322 Adjustment disorder with anxiety: Secondary | ICD-10-CM | POA: Diagnosis not present

## 2017-12-14 DIAGNOSIS — F4322 Adjustment disorder with anxiety: Secondary | ICD-10-CM | POA: Diagnosis not present

## 2018-01-04 DIAGNOSIS — F4322 Adjustment disorder with anxiety: Secondary | ICD-10-CM | POA: Diagnosis not present

## 2018-01-25 DIAGNOSIS — F4322 Adjustment disorder with anxiety: Secondary | ICD-10-CM | POA: Diagnosis not present

## 2018-02-01 DIAGNOSIS — F4322 Adjustment disorder with anxiety: Secondary | ICD-10-CM | POA: Diagnosis not present

## 2018-02-22 DIAGNOSIS — J40 Bronchitis, not specified as acute or chronic: Secondary | ICD-10-CM | POA: Diagnosis not present

## 2018-05-08 DIAGNOSIS — Z3200 Encounter for pregnancy test, result unknown: Secondary | ICD-10-CM | POA: Diagnosis not present

## 2018-05-08 DIAGNOSIS — O09521 Supervision of elderly multigravida, first trimester: Secondary | ICD-10-CM | POA: Diagnosis not present

## 2018-05-08 DIAGNOSIS — Z3A01 Less than 8 weeks gestation of pregnancy: Secondary | ICD-10-CM | POA: Diagnosis not present

## 2018-05-14 DIAGNOSIS — O09521 Supervision of elderly multigravida, first trimester: Secondary | ICD-10-CM | POA: Diagnosis not present

## 2018-05-14 DIAGNOSIS — Z3A01 Less than 8 weeks gestation of pregnancy: Secondary | ICD-10-CM | POA: Diagnosis not present

## 2018-05-17 DIAGNOSIS — O3680X0 Pregnancy with inconclusive fetal viability, not applicable or unspecified: Secondary | ICD-10-CM | POA: Diagnosis not present

## 2018-05-17 DIAGNOSIS — N898 Other specified noninflammatory disorders of vagina: Secondary | ICD-10-CM | POA: Diagnosis not present

## 2018-05-17 DIAGNOSIS — O26851 Spotting complicating pregnancy, first trimester: Secondary | ICD-10-CM | POA: Diagnosis not present

## 2018-05-31 DIAGNOSIS — Z3201 Encounter for pregnancy test, result positive: Secondary | ICD-10-CM | POA: Diagnosis not present

## 2018-06-06 DIAGNOSIS — O09521 Supervision of elderly multigravida, first trimester: Secondary | ICD-10-CM | POA: Diagnosis not present

## 2018-06-06 DIAGNOSIS — Z3A01 Less than 8 weeks gestation of pregnancy: Secondary | ICD-10-CM | POA: Diagnosis not present

## 2018-06-06 DIAGNOSIS — Z3689 Encounter for other specified antenatal screening: Secondary | ICD-10-CM | POA: Diagnosis not present

## 2018-06-21 DIAGNOSIS — F4322 Adjustment disorder with anxiety: Secondary | ICD-10-CM | POA: Diagnosis not present

## 2018-06-21 DIAGNOSIS — Z23 Encounter for immunization: Secondary | ICD-10-CM | POA: Diagnosis not present

## 2018-06-21 DIAGNOSIS — Z3A10 10 weeks gestation of pregnancy: Secondary | ICD-10-CM | POA: Diagnosis not present

## 2018-06-21 DIAGNOSIS — O09521 Supervision of elderly multigravida, first trimester: Secondary | ICD-10-CM | POA: Diagnosis not present

## 2018-06-21 DIAGNOSIS — Z113 Encounter for screening for infections with a predominantly sexual mode of transmission: Secondary | ICD-10-CM | POA: Diagnosis not present

## 2018-07-11 DIAGNOSIS — Z3A13 13 weeks gestation of pregnancy: Secondary | ICD-10-CM | POA: Diagnosis not present

## 2018-07-11 DIAGNOSIS — O09521 Supervision of elderly multigravida, first trimester: Secondary | ICD-10-CM | POA: Diagnosis not present

## 2018-08-01 DIAGNOSIS — Z3A16 16 weeks gestation of pregnancy: Secondary | ICD-10-CM | POA: Diagnosis not present

## 2018-08-01 DIAGNOSIS — Z361 Encounter for antenatal screening for raised alphafetoprotein level: Secondary | ICD-10-CM | POA: Diagnosis not present

## 2018-08-01 DIAGNOSIS — O09522 Supervision of elderly multigravida, second trimester: Secondary | ICD-10-CM | POA: Diagnosis not present

## 2018-08-09 DIAGNOSIS — H6690 Otitis media, unspecified, unspecified ear: Secondary | ICD-10-CM | POA: Diagnosis not present

## 2018-08-12 DIAGNOSIS — Z3A17 17 weeks gestation of pregnancy: Secondary | ICD-10-CM | POA: Diagnosis not present

## 2018-08-12 DIAGNOSIS — O09522 Supervision of elderly multigravida, second trimester: Secondary | ICD-10-CM | POA: Diagnosis not present

## 2018-08-21 DIAGNOSIS — H9201 Otalgia, right ear: Secondary | ICD-10-CM | POA: Diagnosis not present

## 2018-09-09 DIAGNOSIS — Z3A21 21 weeks gestation of pregnancy: Secondary | ICD-10-CM | POA: Diagnosis not present

## 2018-09-09 DIAGNOSIS — O09522 Supervision of elderly multigravida, second trimester: Secondary | ICD-10-CM | POA: Diagnosis not present

## 2018-09-18 NOTE — L&D Delivery Note (Signed)
Delivery Note At 10:28 PM a viable female was delivered via Vaginal, Spontaneous (Presentation:DOA ;  ).  APGAR: 9, 9; weight 5 lb 1.1 oz (2300 g).   Placenta status: retained placenta, after 30 minutes without delivery options reviewed with pt, decision made to give IV pain meds and IV nitroglycerin. Several doses of nitro given, bp dropped to 90s/50s, pt started feeling dizzy. Decision made to proceed with manual extraction, consent obtained, 3gIV Unasyn ordered. Sterile hand placed into uterus, used to create plane between placenta and uterus then placenta removed intact. Placenta evaluated, intact, no evidence abruption. Pitocin started, fundal massage done, IVF bolusing..  Cord:  3VC with the following complications: retained placenta, prematurity.  Cord pH: pending  Anesthesia: none  Episiotomy:  none Lacerations:  Shallow 2nd degree Suture Repair: 3.0 vicryl rapide Est. Blood Loss (mL):  250  Mom to postpartum.  Baby to NICU.  Lendon Colonel 12/06/2018, 11:19 PM

## 2018-10-10 DIAGNOSIS — O09522 Supervision of elderly multigravida, second trimester: Secondary | ICD-10-CM | POA: Diagnosis not present

## 2018-10-10 DIAGNOSIS — Z3689 Encounter for other specified antenatal screening: Secondary | ICD-10-CM | POA: Diagnosis not present

## 2018-10-10 DIAGNOSIS — Z3A26 26 weeks gestation of pregnancy: Secondary | ICD-10-CM | POA: Diagnosis not present

## 2018-10-31 DIAGNOSIS — O09523 Supervision of elderly multigravida, third trimester: Secondary | ICD-10-CM | POA: Diagnosis not present

## 2018-10-31 DIAGNOSIS — Z23 Encounter for immunization: Secondary | ICD-10-CM | POA: Diagnosis not present

## 2018-10-31 DIAGNOSIS — Z3689 Encounter for other specified antenatal screening: Secondary | ICD-10-CM | POA: Diagnosis not present

## 2018-10-31 DIAGNOSIS — Z3A29 29 weeks gestation of pregnancy: Secondary | ICD-10-CM | POA: Diagnosis not present

## 2018-11-15 DIAGNOSIS — Z3A31 31 weeks gestation of pregnancy: Secondary | ICD-10-CM | POA: Diagnosis not present

## 2018-11-15 DIAGNOSIS — O09523 Supervision of elderly multigravida, third trimester: Secondary | ICD-10-CM | POA: Diagnosis not present

## 2018-11-28 DIAGNOSIS — O09523 Supervision of elderly multigravida, third trimester: Secondary | ICD-10-CM | POA: Diagnosis not present

## 2018-11-28 DIAGNOSIS — Z3A33 33 weeks gestation of pregnancy: Secondary | ICD-10-CM | POA: Diagnosis not present

## 2018-12-06 ENCOUNTER — Inpatient Hospital Stay (HOSPITAL_COMMUNITY): Payer: BLUE CROSS/BLUE SHIELD

## 2018-12-06 ENCOUNTER — Inpatient Hospital Stay (HOSPITAL_COMMUNITY)
Admission: AD | Admit: 2018-12-06 | Discharge: 2018-12-08 | DRG: 806 | Disposition: A | Discharge status: Home / Self Care | Payer: BLUE CROSS/BLUE SHIELD | Attending: Obstetrics and Gynecology | Admitting: Obstetrics and Gynecology

## 2018-12-06 ENCOUNTER — Other Ambulatory Visit: Payer: Self-pay

## 2018-12-06 ENCOUNTER — Encounter (HOSPITAL_COMMUNITY): Payer: Self-pay | Admitting: *Deleted

## 2018-12-06 DIAGNOSIS — O09523 Supervision of elderly multigravida, third trimester: Secondary | ICD-10-CM

## 2018-12-06 DIAGNOSIS — Z3A34 34 weeks gestation of pregnancy: Secondary | ICD-10-CM | POA: Diagnosis not present

## 2018-12-06 DIAGNOSIS — O26843 Uterine size-date discrepancy, third trimester: Secondary | ICD-10-CM

## 2018-12-06 DIAGNOSIS — O42013 Preterm premature rupture of membranes, onset of labor within 24 hours of rupture, third trimester: Secondary | ICD-10-CM | POA: Diagnosis not present

## 2018-12-06 DIAGNOSIS — R0603 Acute respiratory distress: Secondary | ICD-10-CM | POA: Diagnosis not present

## 2018-12-06 DIAGNOSIS — O42913 Preterm premature rupture of membranes, unspecified as to length of time between rupture and onset of labor, third trimester: Secondary | ICD-10-CM | POA: Diagnosis not present

## 2018-12-06 DIAGNOSIS — O99824 Streptococcus B carrier state complicating childbirth: Secondary | ICD-10-CM | POA: Diagnosis not present

## 2018-12-06 DIAGNOSIS — K5900 Constipation, unspecified: Secondary | ICD-10-CM | POA: Diagnosis not present

## 2018-12-06 DIAGNOSIS — Z363 Encounter for antenatal screening for malformations: Secondary | ICD-10-CM | POA: Diagnosis not present

## 2018-12-06 DIAGNOSIS — O34211 Maternal care for low transverse scar from previous cesarean delivery: Secondary | ICD-10-CM | POA: Diagnosis present

## 2018-12-06 DIAGNOSIS — Z98891 History of uterine scar from previous surgery: Secondary | ICD-10-CM

## 2018-12-06 DIAGNOSIS — O42919 Preterm premature rupture of membranes, unspecified as to length of time between rupture and onset of labor, unspecified trimester: Secondary | ICD-10-CM

## 2018-12-06 DIAGNOSIS — Z3A00 Weeks of gestation of pregnancy not specified: Secondary | ICD-10-CM | POA: Diagnosis not present

## 2018-12-06 DIAGNOSIS — Z23 Encounter for immunization: Secondary | ICD-10-CM | POA: Diagnosis not present

## 2018-12-06 DIAGNOSIS — O4290 Premature rupture of membranes, unspecified as to length of time between rupture and onset of labor, unspecified weeks of gestation: Secondary | ICD-10-CM | POA: Diagnosis present

## 2018-12-06 DIAGNOSIS — O2243 Hemorrhoids in pregnancy, third trimester: Secondary | ICD-10-CM | POA: Diagnosis not present

## 2018-12-06 LAB — CBC
HEMATOCRIT: 36.6 % (ref 36.0–46.0)
Hemoglobin: 12.3 g/dL (ref 12.0–15.0)
MCH: 30.7 pg (ref 26.0–34.0)
MCHC: 33.6 g/dL (ref 30.0–36.0)
MCV: 91.3 fL (ref 80.0–100.0)
NRBC: 0 % (ref 0.0–0.2)
Platelets: 211 10*3/uL (ref 150–400)
RBC: 4.01 MIL/uL (ref 3.87–5.11)
RDW: 13.1 % (ref 11.5–15.5)
WBC: 12.7 10*3/uL — ABNORMAL HIGH (ref 4.0–10.5)

## 2018-12-06 LAB — URINALYSIS, ROUTINE W REFLEX MICROSCOPIC
Bilirubin Urine: NEGATIVE
Glucose, UA: NEGATIVE mg/dL
Ketones, ur: NEGATIVE mg/dL
Leukocytes,Ua: NEGATIVE
NITRITE: NEGATIVE
PROTEIN: NEGATIVE mg/dL
SPECIFIC GRAVITY, URINE: 1.012 (ref 1.005–1.030)
pH: 6 (ref 5.0–8.0)

## 2018-12-06 LAB — TYPE AND SCREEN
ABO/RH(D): O POS
ANTIBODY SCREEN: NEGATIVE

## 2018-12-06 LAB — ABO/RH: ABO/RH(D): O POS

## 2018-12-06 LAB — GROUP B STREP BY PCR: Group B strep by PCR: POSITIVE — AB

## 2018-12-06 MED ORDER — AZITHROMYCIN 250 MG PO TABS
1000.0000 mg | ORAL_TABLET | Freq: Once | ORAL | Status: AC
  Administered 2018-12-06: 1000 mg via ORAL
  Filled 2018-12-06: qty 4

## 2018-12-06 MED ORDER — AZITHROMYCIN 1 G PO PACK: 1.0000 g | PACK | Freq: Once | ORAL | Status: DC

## 2018-12-06 MED ORDER — NITROGLYCERIN FOR UTERINE RELAXATION OPTIME 200MCG/ML
50.0000 ug | INTRAVENOUS | Status: AC | PRN
  Administered 2018-12-06 (×2): 50 ug via INTRAVENOUS
  Administered 2018-12-06: 100 ug via INTRAVENOUS
  Administered 2018-12-06: 50 ug via INTRAVENOUS
  Filled 2018-12-06: qty 250

## 2018-12-06 MED ORDER — EPHEDRINE 5 MG/ML INJ
10.0000 mg | INTRAVENOUS | Status: DC | PRN
  Filled 2018-12-06: qty 2

## 2018-12-06 MED ORDER — DOCUSATE SODIUM 100 MG PO CAPS
100.0000 mg | ORAL_CAPSULE | Freq: Every day | ORAL | Status: DC
  Filled 2018-12-06: qty 1

## 2018-12-06 MED ORDER — PRENATAL MULTIVITAMIN CH
1.0000 | ORAL_TABLET | Freq: Every day | ORAL | Status: DC
  Filled 2018-12-06: qty 1

## 2018-12-06 MED ORDER — SODIUM CHLORIDE 0.9 % IV SOLN
3.0000 g | Freq: Once | INTRAVENOUS | Status: AC
  Administered 2018-12-07: 3 g via INTRAVENOUS
  Filled 2018-12-06: qty 3

## 2018-12-06 MED ORDER — LACTATED RINGERS IV BOLUS
1000.0000 mL | Freq: Once | INTRAVENOUS | Status: AC
  Administered 2018-12-06: 1000 mL via INTRAVENOUS

## 2018-12-06 MED ORDER — BUTORPHANOL TARTRATE 1 MG/ML IJ SOLN
1.0000 mg | Freq: Once | INTRAMUSCULAR | Status: AC
  Administered 2018-12-06: 1 mg via INTRAVENOUS
  Filled 2018-12-06: qty 1

## 2018-12-06 MED ORDER — OXYTOCIN 40 UNITS IN NORMAL SALINE INFUSION - SIMPLE MED
INTRAVENOUS | Status: AC
  Administered 2018-12-06: 23:00:00
  Filled 2018-12-06: qty 1000

## 2018-12-06 MED ORDER — FENTANYL-BUPIVACAINE-NACL 0.5-0.125-0.9 MG/250ML-% EP SOLN
12.0000 mL/h | EPIDURAL | Status: DC | PRN
  Filled 2018-12-06: qty 250

## 2018-12-06 MED ORDER — LACTATED RINGERS IV SOLN: 500.0000 mL | Freq: Once | INTRAVENOUS | Status: DC

## 2018-12-06 MED ORDER — NITROGLYCERIN IN D5W 200-5 MCG/ML-% IV SOLN: 50.0000 ug/min | Freq: Once | INTRAVENOUS | Status: DC | PRN

## 2018-12-06 MED ORDER — BETAMETHASONE SOD PHOS & ACET 6 (3-3) MG/ML IJ SUSP
12.0000 mg | INTRAMUSCULAR | Status: DC
  Administered 2018-12-06: 12 mg via INTRAMUSCULAR
  Filled 2018-12-06 (×2): qty 2

## 2018-12-06 MED ORDER — LIDOCAINE HCL (PF) 1 % IJ SOLN
INTRAMUSCULAR | Status: AC
  Filled 2018-12-06: qty 30

## 2018-12-06 MED ORDER — CALCIUM CARBONATE ANTACID 500 MG PO CHEW: 2.0000 | CHEWABLE_TABLET | ORAL | Status: DC | PRN

## 2018-12-06 MED ORDER — PHENYLEPHRINE 40 MCG/ML (10ML) SYRINGE FOR IV PUSH (FOR BLOOD PRESSURE SUPPORT)
80.0000 ug | PREFILLED_SYRINGE | INTRAVENOUS | Status: DC | PRN
  Filled 2018-12-06: qty 10

## 2018-12-06 MED ORDER — DIPHENHYDRAMINE HCL 50 MG/ML IJ SOLN: 12.5000 mg | INTRAMUSCULAR | Status: DC | PRN

## 2018-12-06 MED ORDER — PHENYLEPHRINE 40 MCG/ML (10ML) SYRINGE FOR IV PUSH (FOR BLOOD PRESSURE SUPPORT)
80.0000 ug | PREFILLED_SYRINGE | INTRAVENOUS | Status: DC | PRN
  Filled 2018-12-06 (×2): qty 10

## 2018-12-06 MED ORDER — ZOLPIDEM TARTRATE 5 MG PO TABS: 5.0000 mg | ORAL_TABLET | Freq: Every evening | ORAL | Status: DC | PRN

## 2018-12-06 MED ORDER — ACETAMINOPHEN 325 MG PO TABS: 650.0000 mg | ORAL_TABLET | ORAL | Status: DC | PRN

## 2018-12-06 MED ORDER — SODIUM CHLORIDE 0.9 % IV SOLN
2.0000 g | Freq: Four times a day (QID) | INTRAVENOUS | Status: DC
  Administered 2018-12-06 (×2): 2 g via INTRAVENOUS
  Filled 2018-12-06 (×3): qty 2000

## 2018-12-06 MED ORDER — LACTATED RINGERS IV SOLN
INTRAVENOUS | Status: DC
  Administered 2018-12-06 (×2): via INTRAVENOUS

## 2018-12-06 MED ORDER — NIFEDIPINE 10 MG PO CAPS
20.0000 mg | ORAL_CAPSULE | Freq: Once | ORAL | Status: AC
  Administered 2018-12-06: 20 mg via ORAL
  Filled 2018-12-06: qty 2

## 2018-12-06 MED ORDER — NIFEDIPINE 10 MG PO CAPS
10.0000 mg | ORAL_CAPSULE | Freq: Four times a day (QID) | ORAL | Status: DC
  Administered 2018-12-06: 10 mg via ORAL
  Filled 2018-12-06: qty 1

## 2018-12-06 NOTE — H&P (Signed)
Kelsey Cox is a 38 y.o. female presenting with PPROM today. Pt was evaluated by MAU provider( see note. (+) FM (+) irreg ctx. Uncomplicated PNC  Hx LTCS due to breech .  OB History    Gravida  3   Para  1   Term  1   Preterm      AB  1   Living  1     SAB  1   TAB      Ectopic      Multiple      Live Births  1          Past Medical History:  Diagnosis Date  . Euthyroid goiter   . 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 Renal stone    age 38, passed  . UTI (urinary tract infection)   . Yeast infection    chronic   Past Surgical History:  Procedure Laterality Date  . CESAREAN SECTION N/A 06/01/2014   Procedure: CESAREAN SECTION;  Surgeon: Lenoard Adenichard J Taavon, MD;  Location: [facility];  Service: Obstetrics;  Laterality: N/A;  . HYSTEROSCOPY     with polyp resection  . WISDOM TOOTH EXTRACTION     Family History: family history includes Breast cancer (age of onset: 6861) in her mother; Breast cancer (age of onset: 3665) in her maternal grandmother; Cancer in her paternal grandmother; Diabetes in her father and paternal grandmother; Heart attack in her paternal grandfather; Hypertension in her father. Social History:  reports that she has never smoked. She has never used smokeless tobacco. She reports previous alcohol use of about 7.0 standard drinks of alcohol per week. She reports that she does not use drugs.     Maternal Diabetes: No Genetic Screening: Normal Maternal Ultrasounds/Referrals: Normal Fetal Ultrasounds or other Referrals:  None Maternal Substance Abuse:  No Significant Maternal Medications:  None Significant Maternal Lab Results:  None Other Comments:  hx goiter with nl TSH  Review of Systems  All other systems reviewed and are negative.  Maternal Medical History:  Reason for admission: Rupture of membranes and contractions.   Fetal activity: Perceived fetal activity is normal.    Prenatal Complications - Diabetes: none.    Dilation:  Fingertip Effacement (%): 70 Station: -2 Exam by:: Dr. Cherly Hensenousins Blood pressure 128/71, pulse 67, temperature 98.2 F (36.8 C), temperature source Oral, resp. rate 18, height 5\' 7"  (1.702 m), weight 63.5 kg, unknown if currently breastfeeding. Maternal Exam:  Abdomen: Patient reports no abdominal tenderness. Surgical scars: low transverse.   Fetal presentation: vertex  Introitus: Ferning test: positive.      Physical Exam  Constitutional: She is oriented to person, place, and time. She appears well-developed and well-nourished.  HENT:  Head: Atraumatic.  Eyes: EOM are normal.  Neck: Neck supple.  Cardiovascular: Regular rhythm.  GI: Soft.  Genitourinary:    Genitourinary Comments: Ft/70/-2 post soft   Neurological: She is alert and oriented to person, place, and time. She has normal reflexes.  Skin: Skin is warm and dry.  Psychiatric: She has a normal mood and affect.    Prenatal labs: ABO, Rh:  O positive Antibody:  negative Rubella:  immune RPR:   NR HBsAg:   neg HIV:   neg GBS:   pending  Assessment/Plan: PPROM IUP@ 34 3/7 weeks Previous LTCS P) admit routine labs. Sonogram for EFW. GBS cx by PCR. BMZ today.   NICU consult  Shaylynne Lunt A Misaki Sozio 12/06/2018, 12:19 PM  addendum [place] [facility] Ob  Detail +14 Wk  Result Date: 12/06/2018 ----------------------------------------------------------------------  OBSTETRICS REPORT                       (Signed Final 12/06/2018 11:41 am) ---------------------------------------------------------------------- Patient Info  ID #:       [id number]                          D.O.B.:  04/11/1981 (37 yrs)  Name:       Kelsey Cox               Visit Date: 12/06/2018 10:08 am ---------------------------------------------------------------------- Performed By  Performed By:     Hurman Horn          Referred By:      MAU Nursing-                    RDMS                                     MAU/Triage  Attending:        Noralee Space MD         [facility] ---------------------------------------------------------------------- Orders   #  Description                          Code         Ordered By   1  [place] MFM OB DETAIL +14 WK              76811.01     LISA LEFTWICH-                                                        KIRBY  ----------------------------------------------------------------------   #  Order #                    Accession #                 Episode #   1  [id number]                  [id number]                  [id number]  ---------------------------------------------------------------------- Indications   Advanced maternal age multigravida 44+,        O42.523   third trimester   Uterine size-date discrepancy, third trimester O26.843   Premature rupture of membranes - leaking       O42.90   fluid   Encounter for antenatal screening for          Z36.3   malformations   [redacted] weeks gestation of pregnancy                Z3A.34  ----------------------------------------------------------------------  Fetal Evaluation  Num Of Fetuses:         1  Fetal Heart Rate(bpm):  153  Cardiac Activity:       Observed  Presentation:           Cephalic  Placenta:               Anterior  P. Cord Insertion:      Visualized, central  Amniotic Fluid  AFI FV:      Within normal limits  AFI Sum(cm)     %Tile       Largest Pocket(cm)  11.2            29          3.71  RUQ(cm)       RLQ(cm)       LUQ(cm)        LLQ(cm)  3.71          0.86          3.01           3.62  Comment:    No placental abruption or previa identified. ---------------------------------------------------------------------- Biometry  BPD:      91.4  mm     G. Age:  37w 1d         98  %    CI:        86.55   %    70 - 86                                                          FL/HC:      21.4   %    19.4 - 21.8  HC:      309.3  mm     G. Age:  34w 4d         20  %    HC/AC:      1.06        0.96 - 1.11  AC:       292.7  mm     G. Age:  33w 2d         23  %    FL/BPD:     72.3   %    71 - 87  FL:       66.1  mm     G. Age:  34w 0d         31  %    FL/AC:      22.6   %    20 - 24  HUM:      59.2  mm     G. Age:  34w 2d         59  %  Est. FW:    2318  gm      5 lb 2 oz     51  % ---------------------------------------------------------------------- OB History  Gravidity:    3         Term:   1         SAB:   1 ---------------------------------------------------------------------- Gestational Age  Clinical EDD:  34w 3d  EDD:   01/14/19  U/S Today:     34w 5d                                        EDD:   01/12/19  Best:          34w 3d     Det. By:  Clinical EDD             EDD:   01/14/19 ---------------------------------------------------------------------- Anatomy  Cranium:               Appears normal         Aortic Arch:            Appears normal  Cavum:                 Not well visualized    Ductal Arch:            Not well visualized  Ventricles:            Not well visualized    Diaphragm:              Appears normal  Choroid Plexus:        Not well visualized    Stomach:                Appears normal, left                                                                        sided  Cerebellum:            Not well visualized    Abdomen:                Appears normal  Posterior Fossa:       Not well visualized    Abdominal Wall:         Appears nml (cord                                                                        insert, abd wall)  Nuchal Fold:           Not applicable (>20    Cord Vessels:           Appears normal ([redacted]                         wks GA)                                        vessel cord)  Face:                  Appears normal         Kidneys:  Appear normal                         (orbits and profile)  Lips:                  Appears normal         Bladder:                Appears normal  Thoracic:              Appears normal         Spine:                   Appears normal  Heart:                 Appears normal         Upper Extremities:      NWS                         (4CH, axis, and                         situs)  RVOT:                  Appears normal         Lower Extremities:      Appears normal  LVOT:                  Appears normal  Other:  Technically difficult due to fetal position. ---------------------------------------------------------------------- Impression  Patient is being evaluated at the MAU for c/o leakage of  amniotic fluid.  Fetal growth is consistent with her previously-established  dates. Amniotic fluid is normal and good fetal activity is seen.  Fetal anatomy appears normal, but limited by advanced  gestational age. Placenta appears normal with no previa or  abruption. ---------------------------------------------------------------------- Recommendations  -Decision to deliver should be based on clinical diagnosis of  PPROM. Amniotic fluid may be normal on ultrasound in  patients with PPROM. ----------------------------------------------------------------------                  Noralee Space, MD Electronically Signed Final Report   12/06/2018 11:41 am ---------------------------------------------------------------------- pt seen regarding plan of care and mode of delivery( VBAC vs Repeat C/S. Pt advised choice is hers. Also disc need to see NICU MD. Disc standard in this community is to deliver at 34 wk with ROM. However that opinion may differ from pediatric stand point . She may opt to prolong pregnancy by waiting but in that case would need antibiotics. Examined pt to determine cervical status. Thought cervix manageable with use of cervical balloon and pitocin if proceeding with delivery vaginally  Vs proceed with C/S. Disc also 2nd dose of BMZ. I had to go to surgery so the pt was left to decide on route of delivery to then determine plan of care

## 2018-12-06 NOTE — Consult Note (Signed)
Neonatology Consult  Note:  At the request of the patients obstetrician Dr. Garwin Brothers I met with Kelsey Cox who is a 38 y.o. G3P1011 at 66w3dby LMP with pregnancy complicated by PPROM.  BMZ x 1 given, Rapid GBS pending.  We reviewed initial delivery room management, including CPAP, [place], and low but certainly possible need for intubation for surfactant administration.  We discussed feeding immaturity and need for full po intake with multiple days of good weight gain and no apnea or bradycardia before discharge.  We reviewed increased risk of jaundice, infection, and temperature instability.   Discussed likely length of stay.  Thank you for allowing [facility] participate in her care.  Please call with questions.  BHiginio Roger DO  Neonatologist   The total length of face-to-face or floor / unit time for this encounter was 20 minutes.  Counseling and / or coordination of care was greater than fifty percent of the time.

## 2018-12-06 NOTE — MAU Provider Note (Signed)
Chief Complaint:  Rupture of Membranes   First Provider Initiated Contact with Patient 12/06/18 0801      HPI: Kelsey Cox is a 38 y.o. G3P1011 at [redacted]w[redacted]d by LMP who presents to maternity admissions reporting gush of pink tinged fluid at 0630 today and continuous leakage since then.  She reports normal fetal movement and denies any cramping or contractions. There are no other symptoms. She has not tried any treatments. Her pregnancy has been uncomplicated, prior pregnancy delivered at term via scheduled C/S for breech position. She is undecided about repeat C/S vs VBAC with this pregnancy.  HPI  Past Medical History: Past Medical History:  Diagnosis Date  . Euthyroid goiter   . 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 Renal stone    age 46, passed  . UTI (urinary tract infection)   . Yeast infection    chronic    Past obstetric history: OB History  Gravida Para Term Preterm AB Living  SAB TAB Ectopic Multiple Live Births  1       1    # Outcome Date GA Lbr Len/2nd Weight Sex Delivery Anes PTL Lv  3 Current           2 Term 06/01/14 [redacted]w[redacted]d  2605 g F CS-LTranv   LIV  1 SAB             Past Surgical History: Past Surgical History:  Procedure Laterality Date  . CESAREAN SECTION N/A 06/01/2014   Procedure: CESAREAN SECTION;  Surgeon: Lenoard Aden, MD;  Location: [facility];  Service: Obstetrics;  Laterality: N/A;  . HYSTEROSCOPY     with polyp resection  . WISDOM TOOTH EXTRACTION      Family History: Family History  Problem Relation Age of Onset  . Diabetes Father   . Hypertension Father   . Breast cancer Mother 36  . Diabetes Paternal Grandmother   . Cancer Paternal Grandmother        GI cancer, lung cancer  . Breast cancer Maternal Grandmother 89  . Heart attack Paternal Grandfather     Social History: Social History   Tobacco Use  . Smoking status: Never Smoker  . Smokeless tobacco: Never Used  Substance Use Topics  . Alcohol use: Not Currently    Alcohol/week: 7.0 standard drinks    Types: 7 Standard drinks or equivalent per week    Comment: NONE WHILE PREG  . Drug use: No    Allergies:  Allergies  Allergen Reactions  . Monistat [Miconazole]     swelling  . Latex     Rash    Meds:  Medications Prior to Admission  Medication Sig Dispense Refill Last Dose  . ibuprofen (ADVIL,MOTRIN) 600 MG tablet Take 1 tablet (600 mg total) by mouth every 6 (six) hours. 30 tablet 1   . oxyCODONE-acetaminophen (PERCOCET/ROXICET) 5-325 MG per tablet Take 1 tablet by mouth every 4 (four) hours as needed (for pain scale less than 7). 30 tablet 0   . Prenatal Vit-Fe Fumarate-FA (PRENATAL MULTIVITAMIN) TABS tablet Take 1 tablet by mouth daily at 12 noon.   05/31/2014 at Unknown time    ROS:  Review of Systems  Constitutional: Negative for chills, fatigue and fever.  Eyes: Negative for visual disturbance.  Respiratory: Negative for shortness of breath.   Cardiovascular: Negative for chest pain.  Gastrointestinal: Negative for abdominal pain, nausea and vomiting.  Genitourinary: Positive for vaginal bleeding and  vaginal discharge. Negative for difficulty urinating, dysuria, flank pain, pelvic pain and vaginal pain.  Neurological: Negative for dizziness and headaches.  Psychiatric/Behavioral: Negative.      I have reviewed patient's Past Medical Hx, Surgical Hx, Family Hx, Social Hx, medications and allergies.   Physical Exam   Patient Vitals for the past 24 hrs:  BP Temp Temp src Pulse Resp Height Weight  12/06/18 0743 132/85 97.6 F (36.4 C) Oral 84 18 5\' 7"  (1.702 m) -  12/06/18 0734 - - - - - - 63.5 kg   Constitutional: Well-developed, well-nourished female in no acute distress.  Cardiovascular: normal rate Respiratory: normal effort GI: Abd soft, non-tender, gravid appropriate for gestational age.  MS: Extremities nontender, no edema, normal ROM Neurologic: Alert and oriented x 4.  GU: Neg CVAT.  PELVIC EXAM: Cervix pink,  visually closed and thick, positive pooling of pink tinged fluid with visible vernix, vaginal walls and external genitalia normal   Presentation: Vertex(Verified by bedside U/S.)  FHT:  Baseline 140 , moderate variability, accelerations present, no decelerations Contractions: q 3-15 mins, mild to palpation   Labs: Results for orders placed or performed during the hospital encounter of 12/06/18 (from the past 24 hour(s))  Urinalysis, Routine w reflex microscopic     Status: Abnormal   Collection Time: 12/06/18  7:35 AM  Result Value Ref Range   Color, Urine YELLOW YELLOW   APPearance HAZY (A) CLEAR   Specific Gravity, Urine 1.012 1.005 - 1.030   pH 6.0 5.0 - 8.0   Glucose, UA NEGATIVE NEGATIVE mg/dL   Hgb urine dipstick LARGE (A) NEGATIVE   Bilirubin Urine NEGATIVE NEGATIVE   Ketones, ur NEGATIVE NEGATIVE mg/dL   Protein, ur NEGATIVE NEGATIVE mg/dL   Nitrite NEGATIVE NEGATIVE   Leukocytes,Ua NEGATIVE NEGATIVE   RBC / HPF 0-5 0 - 5 RBC/hpf   WBC, UA 0-5 0 - 5 WBC/hpf   Bacteria, UA RARE (A) NONE SEEN   Squamous Epithelial / LPF 6-10 0 - 5   Mucus PRESENT       Imaging:  No results found. Limited OB US Date: 12/06/18 EDD : 01/14/19 based on LMP Viability:  FHT detected  Fetal position: Vertex noted with cranial features identified in pelvis on today's [place] Pt informed that the ultrasound is considered a limited OB ultrasound and is not intended to be a complete ultrasound exam.  Patient also informed that the ultrasound is not being completed with the intent of assessing for fetal or placental anomalies or any pelvic abnormalities.  Explained that the purpose of today's ultrasound is to assess for  presentation.  Patient acknowledges the purpose of the exam and the limitations of the study.     MAU Course/MDM: Orders Placed This Encounter  Procedures  . Group B strep by PCR  . [place] MFM OB DETAIL +14 WK  . Urinalysis, Routine w reflex microscopic  . CBC  . RPR  . Creatinine  clearance, urine, 24 hour  . Diet NPO time specified  . Notify physician (specify)  . Vital signs  . Fetal monitoring  . Continuous tocometry  . Contraction - monitoring  . Patient may shower  . Daily weights  . Defer vaginal exam for vaginal bleeding or PROM <37 weeks  . Initiate Oral Care Protocol  . Initiate Carrier Fluid Protocol  . SCDs  . Full code  . Consult to neonatology Reason for Consult? PPROM @ 34 3/7  day  . Type and screen  . Admit  to Inpatient (patient's expected length of stay will be greater than 2 midnights or inpatient only procedure)    Meds ordered this encounter  Medications  . betamethasone acetate-betamethasone sodium phosphate (CELESTONE) injection 12 mg  . lactated ringers bolus 1,000 mL  . acetaminophen (TYLENOL) tablet 650 mg  . zolpidem (AMBIEN) tablet 5 mg  . docusate sodium (COLACE) capsule 100 mg  . calcium carbonate (TUMS - dosed in mg elemental calcium) chewable tablet 400 mg of elemental calcium  . prenatal multivitamin tablet 1 tablet  . FOLLOWED BY Linked Order Group   . NIFEdipine (PROCARDIA) capsule 20 mg   . NIFEdipine (PROCARDIA) capsule 10 mg     NST reviewed and reactive Pooling and ferning positive, clear evidence of PPROM without onset of labor.  Some contractions on toco, irregular, and pt not feeling any cramping Pt undecided about delivery method.  First baby 5 lbs at term.  This baby 3-4 lbs by [place], vertex by bedside US.  Visually closed and thick, no SVE performed.  Consult Dr Cherly Hensen with presentation, exam findings and test results.  BMZ x 1, formal US with MFM for measurements Dr Cherly Hensen to place orders for admission   Assessment: 1. History of cesarean section, low vertical   2. Uterine size date discrepancy pregnancy, third trimester   3. Preterm premature rupture of membranes (PPROM) with unknown onset of labor     Plan: Dr Cherly Hensen to admit pt Pt deciding between VBAC and RLTCS [place] for EFW pending BMZ x 1  given Rapid GBS pending  Sharen Counter Certified Nurse-Midwife 12/06/2018 10:54 AM

## 2018-12-06 NOTE — Progress Notes (Signed)
Returned to disc plan of care and answer additional ?. Pt has decided to wait for 2nd dose of BMZ. She has not seen NICU as yet. Advised pt Dr Ernestina Penna is on call and will transfer care to her. She will let Dr Ernestina Penna know her plan regarding route of delivery (+) GBS. Dr Ernestina Penna notified and informed PPROM antibiotics had not been started

## 2018-12-06 NOTE — Progress Notes (Signed)
Called to see patient for increased contraction  Subjective: Patient notes contraction pain at a 7 whereas earlier it was at a 2.  Contractions now coming every 2 to 3 minutes.  Still leaking blood-tinged light pink fluid.  No fundal tenderness.  Good fetal movement  Vitals:   12/06/18 1120 12/06/18 1255 12/06/18 1300 12/06/18 1824  BP: 128/71 106/74  123/63  Pulse: 71 (!) 102  (!) 114  Resp:  16  20  Temp:  98.8 F (37.1 C)  98.8 F (37.1 C)  TempSrc:  Oral  Oral  SpO2:  99%  100%  Weight:   63.5 kg   Height:   5\' 7"  (1.702 m)    General: Well-appearing, breathing through contractions : Abdomen no fundal tenderness GU: Cervix 1.5 cm, 90% effaced, soft, vertex 0 station, posterior Toco: Q. 2 to 3 minutes FHT: 150s, positive accelerations, no decelerations, 10 beat variability  CBC    Component Value Date/Time   WBC 12.7 (H) 12/06/2018 1152   RBC 4.01 12/06/2018 1152   HGB 12.3 12/06/2018 1152   HGB 13.2 09/29/2013 1622   HCT 36.6 12/06/2018 1152   PLT 211 12/06/2018 1152   MCV 91.3 12/06/2018 1152   MCH 30.7 12/06/2018 1152   MCHC 33.6 12/06/2018 1152   RDW 13.1 12/06/2018 1152   GBS pos EFW: 5'2  Assessment and plan: 38 year old G3 P1-0-1-1 at 34 weeks 3 days by LMP with known preterm premature rupture of membranes.  Status post betamethasone x1, started on latency antibiotics.  GBS is return positive.  No evidence of chorioamnionitis on admission and discussion was had with patient regarding expectant management versus moved to delivery.  20-minute discussion had with patient around 2 PM today, not prior documented, and given patient had no evidence of chorioamnionitis and was not an active labor decision was made to plan expectant management.  At that time patient was hopeful to receive her second dose of betamethasone tomorrow morning and then make a decision regarding attempted vaginal delivery after prior cesarean section versus repeat cesarean section.  Given that now  active labor is ensuing with some cervical change we again discussed risks benefits of [place] versus repeat cesarean section.  Patient is not planning future deliveries and wants to minimize risk to herself and her baby.  Additional 20 minutes in discussion with the patient regarding all risks and benefits.  Patient understands small risk of uterine rupture with associated risks of bleeding infection, damage to surrounding organs, risks associated with urgent cesarean section and risks of fetal hypoxia.  We also discussed surgical risk with cesarean section.  We discussed risks of prematurity given baby at 34 weeks.  We discussed ability to proceed with [place] and I gave patient the ability to change her mind to proceed with repeat cesarean section at any point during her attempted TolaC.   -Reactive fetal testing  Kelsey Cox 12/06/2018 6:38 PM

## 2018-12-06 NOTE — MAU Note (Signed)
Pt woke up @ 0630 feeling wet, fluid appeared pink on toilet tissue, continues trickling.  Denies pain or bright red bleeding.  Noted some spotting in bathroom in MAU.  Reports good fetal movement.

## 2018-12-07 ENCOUNTER — Encounter (HOSPITAL_COMMUNITY): Payer: Self-pay

## 2018-12-07 LAB — RPR: RPR Ser Ql: NONREACTIVE

## 2018-12-07 MED ORDER — OXYTOCIN 40 UNITS IN NORMAL SALINE INFUSION - SIMPLE MED: 2.5000 [IU]/h | INTRAVENOUS | Status: DC

## 2018-12-07 MED ORDER — IBUPROFEN 600 MG PO TABS
600.0000 mg | ORAL_TABLET | Freq: Four times a day (QID) | ORAL | Status: DC
  Administered 2018-12-07 – 2018-12-08 (×7): 600 mg via ORAL
  Filled 2018-12-07 (×7): qty 1

## 2018-12-07 MED ORDER — SENNOSIDES-DOCUSATE SODIUM 8.6-50 MG PO TABS
2.0000 | ORAL_TABLET | ORAL | Status: DC
  Administered 2018-12-07: 2 via ORAL
  Filled 2018-12-07: qty 2

## 2018-12-07 MED ORDER — ACETAMINOPHEN 325 MG PO TABS: 650.0000 mg | ORAL_TABLET | ORAL | Status: DC | PRN

## 2018-12-07 MED ORDER — OXYCODONE HCL 5 MG PO TABS: 10.0000 mg | ORAL_TABLET | ORAL | Status: DC | PRN

## 2018-12-07 MED ORDER — ONDANSETRON HCL 4 MG PO TABS: 4.0000 mg | ORAL_TABLET | ORAL | Status: DC | PRN

## 2018-12-07 MED ORDER — BENZOCAINE-MENTHOL 20-0.5 % EX AERO: 1.0000 "application " | INHALATION_SPRAY | CUTANEOUS | Status: DC | PRN

## 2018-12-07 MED ORDER — COCONUT OIL OIL
1.0000 "application " | TOPICAL_OIL | Status: DC | PRN
  Administered 2018-12-07: 1 via TOPICAL

## 2018-12-07 MED ORDER — PRENATAL MULTIVITAMIN CH
1.0000 | ORAL_TABLET | Freq: Every day | ORAL | Status: DC
  Administered 2018-12-07 – 2018-12-08 (×2): 1 via ORAL
  Filled 2018-12-07 (×2): qty 1

## 2018-12-07 MED ORDER — ONDANSETRON HCL 4 MG/2ML IJ SOLN: 4.0000 mg | INTRAMUSCULAR | Status: DC | PRN

## 2018-12-07 MED ORDER — DIPHENHYDRAMINE HCL 25 MG PO CAPS: 25.0000 mg | ORAL_CAPSULE | Freq: Four times a day (QID) | ORAL | Status: DC | PRN

## 2018-12-07 MED ORDER — OXYCODONE HCL 5 MG PO TABS: 5.0000 mg | ORAL_TABLET | ORAL | Status: DC | PRN

## 2018-12-07 MED ORDER — DIBUCAINE 1 % RE OINT: 1.0000 "application " | TOPICAL_OINTMENT | RECTAL | Status: DC | PRN

## 2018-12-07 MED ORDER — TETANUS-DIPHTH-ACELL PERTUSSIS 5-2.5-18.5 LF-MCG/0.5 IM SUSP: 0.5000 mL | Freq: Once | INTRAMUSCULAR | Status: DC

## 2018-12-07 MED ORDER — ZOLPIDEM TARTRATE 5 MG PO TABS: 5.0000 mg | ORAL_TABLET | Freq: Every evening | ORAL | Status: DC | PRN

## 2018-12-07 MED ORDER — SIMETHICONE 80 MG PO CHEW: 80.0000 mg | CHEWABLE_TABLET | ORAL | Status: DC | PRN

## 2018-12-07 MED ORDER — WITCH HAZEL-GLYCERIN EX PADS: 1.0000 "application " | MEDICATED_PAD | CUTANEOUS | Status: DC | PRN

## 2018-12-07 NOTE — Lactation Note (Signed)
This note was copied from a baby's chart. Lactation Consultation Note  Patient Name: Kelsey Cox [id number] Date: 12/07/2018 Reason for consult: Initial assessment;Late-preterm 34-36.6wks;Infant < 6lbs;NICU baby P2, 6 hour female infant, LPTI in NICU. Per mom, she has a DEBP at home. Per mom, her eldest child was born at [redacted] weeks gestation and she pumped for 6 months due to infant not latching  to breast. Mom wanted LC to review hand expression and mom expressed 1.5 ml of colostrum that will be taken to NICU labels was given to mom by nurse. Mom plans to pump every 3 hours for 15 minutes on initial setting. Mom shown how to use DEBP & how to disassemble, clean, & reassemble parts. Mom will call LC services if she has any questions or concerns. Mom made aware of O/P services, breastfeeding support groups, community resources, and our phone # for post-discharge questions.    Maternal Data Formula Feeding for Exclusion: No Has patient been taught Hand Expression?: Yes(1.5 colostrum ) Does the patient have breastfeeding experience prior to this delivery?: Yes  Feeding Feeding Type: Donor Breast Milk  LATCH Score                   Interventions Interventions: Breast feeding basics reviewed;Hand express;Expressed milk;DEBP  Lactation Tools Discussed/Used Tools: Pump Breast pump type: Double-Electric Breast Pump WIC Program: No Pump Review: Setup, frequency, and cleaning;Milk Storage Initiated by:: Danelle Earthly, IBCLC Date initiated:: 12/07/18   Consult Status Consult Status: Follow-up Date: 12/08/18 Follow-up type: In-patient    Danelle Earthly 12/07/2018, 5:07 AM

## 2018-12-07 NOTE — Progress Notes (Signed)
PPD #1, VBAC, 2nd degree repair, baby boy in NICU  S:  Reports feeling overall well, minimal discomfort   Desires to stay inpatient as long as possible due to baby in NICU - she states they may not have a couplet care room available             Tolerating po/ No nausea or vomiting / Denies CP, dizziness or SOB/ (+) chronic constipation / (+) small external hemorrhoid             Bleeding is moderate             Pain controlled with Motrin             Up ad lib / ambulatory / voiding QS  Newborn in NICU - stable on CPAP per mom; she is pumping colostrum   O:               VS: BP (!) 98/55 (BP Location: Left Arm)   Pulse 68   Temp 98.6 F (37 C) (Oral)   Resp 16   Ht 5\' 7"  (1.702 m)   Wt 63.5 kg   SpO2 100%   Breastfeeding Unknown   BMI 21.93 kg/m    LABS:              Recent Labs    12/06/18 1152  WBC 12.7*  HGB 12.3  PLT 211               Blood type: --/--/O POS, O POS Performed at [facility], [address], [place], [place] [address]  [phone]/20 1152)  Rubella:                       I&O: Intake/Output      03/20 0701 - 03/21 0700 03/21 0701 - 03/22 0700   IV Piggyback 1381    Total Intake(mL/kg) 1381 (21.7)    Urine (mL/kg/hr) 0    Blood 427    Total Output 427    Net +954         Urine Occurrence 2 x                  Physical Exam:             Alert and oriented X3  Lungs: Clear and unlabored  Heart: regular rate and rhythm / no murmurs  Abdomen: soft, non-tender, non-distended              Fundus: firm, non-tender, U-3  Perineum: well approximated 2nd degree repair, mild edema, mild erythema, no ecchymosis; small external hemorrhoid not thrombosed   Lochia: small, no clots   Extremities: no edema, no calf pain or tenderness    A/P: PPD # 1 VBAC  2nd degree perineal repair    - Warm water sitz baths PRN  S/p Retained placenta with manual extraction   Hx. Of Goiter  Chronic constipation    - on Senokot-S at bedtime; refused dose last  night  Small external hemorrhoid, not thrombosed   - Encouraged Tucks pads  Doing well - stable status  Routine post partum orders  See lactation today  Encouraged to rest  May shower today  D/C IV  Desires to stay inpatient as long as possible; anticipate discharge tomorrow, but pt to call insurance to see if she can stay an extra day.  I discussed it may be a possibility due to preterm delivery and retained placenta., but  will reassess status tomorrow    Carlean Jews, MSN, CNM [facility]

## 2018-12-07 NOTE — Lactation Note (Signed)
This note was copied from a baby's chart. Lactation Consultation Note  Patient Name: Kelsey Cox [id number]'Y Date: 12/07/2018 Reason for consult: Follow-up assessment;Late-preterm 34-36.6wks;NICU baby  As LC entered the room mom resting in bed.  Per mom has pumped only 3 times since the pump was set up.  Mom requested for LC to check left axillary area due to some edema.  Mom mentioned with her 1st baby  she had the same swelling but its  Not has large this time.  LC explained to mom the edema is called and accessory gland and its consider  Benign/ may swell larger when the milk comes in / and use cold cabbage leaves  To the area when it swells after popping the veins in the cabbage until the area drys up.  LC reviewed supply and demand the importance of consistent pumping around the  Clock 8-10 times both breast for 15 -20 mins/ and include hand expressing.  DEBP was already set up.    Maternal Data Has patient been taught Hand Expression?: Yes  Feeding Feeding Type: Donor Breast Milk  LATCH Score                   Interventions Interventions: Breast feeding basics reviewed  Lactation Tools Discussed/Used Tools: Pump Breast pump type: Double-Electric Breast Pump   Consult Status Consult Status: Follow-up Date: 12/08/18 Follow-up type: In-patient    Matilde Sprang Jiaire Rosebrook 12/07/2018, 4:53 PM

## 2018-12-07 NOTE — Progress Notes (Signed)
Pt oob to BR voided peri care done. Pt in WC to NICU with husband. No complaints pt stable

## 2018-12-08 MED ORDER — OXYCODONE HCL 5 MG PO TABS: 5.0000 mg | ORAL_TABLET | ORAL | 0 refills | Status: AC | PRN

## 2018-12-08 MED ORDER — IBUPROFEN 600 MG PO TABS: 600.0000 mg | ORAL_TABLET | Freq: Four times a day (QID) | ORAL | 0 refills | Status: AC

## 2018-12-08 NOTE — Progress Notes (Signed)
Discharge instructions and prescriptions given to pt. Discussed post vag-delivery care, signs and symptoms to report to the MD, upcoming appointments, and meds. Pt verbalizes understanding and has no questions or concerns at this time. Pt discharged from hospital in stable condition.

## 2018-12-08 NOTE — Lactation Note (Signed)
This note was copied from a baby's chart. Lactation Consultation Note  Patient Name: Kelsey Cox [id number]'[id number] Date: 12/08/2018 Reason for consult: Follow-up assessment;Late-preterm 34-36.6wks;NICU baby;Other (Comment)(exp Breast feeding mother - ) [name] is 67 hours old - mom for D/C today per charge RN  LC visited mom in her room and she was ready to pump.  Mom mentioned she increased to the #27 F due to the #24 F 's being uncomfortable Especially on the right side. LC offered to assess breast tissue and fitting for flange.  LC recommended using a dab of coconut oil on each nipple and areola 1st and  Then  Increase flange to #27's. LC also encouraged mom to sit up more upright when pumping  And allow her breast to hang more natural. Per mom mentioned the pumping is more comfortable/  And LC checked the sizing and the #27 is a good fit.  LC instructed mom on the use shells between pumping except when sleeping until sore ness improves/  Alternating with comfort gels x 6 days.  Mom aware she can  have the NICU RN call LC for questions or when the baby is ready to latch.  Also to call [facility] services with questions . NICU booklet provided.  Mother informed of post-discharge support and given phone number to the lactation department, including services for phone call assistance; out-patient appointments; and breastfeeding support group. List of other breastfeeding resources in the community given in the handout. Encouraged mother to call for problems or concerns related to breastfeeding.    Maternal Data Has patient been taught Hand Expression?: Yes  Feeding Feeding Type: Donor Breast Milk  LATCH Score                   Interventions Interventions: Breast feeding basics reviewed;DEBP;Coconut oil;Shells;Comfort gels  Lactation Tools Discussed/Used Tools: Pump;Coconut oil;Comfort gels;Shells;Flanges Flange Size: 27;24;Other (comment)(the #27's are a good fit ) Breast pump type:  Double-Electric Breast Pump Pump Review: Setup, frequency, and cleaning;Milk Storage(reviewed and checked flange - see LC note )   Consult Status Consult Status: PRN Date: (baby is in NICU - mom aware she can have RN call when baby is ready to latch ) Follow-up type: Other (comment)(baby in NICU )    [name] 12/08/2018, 12:30 PM

## 2018-12-08 NOTE — Discharge Summary (Signed)
OB Discharge Summary  Patient Name: Kelsey Cox DOB: Jan 29, 1981 MRN: [id number]  Date of admission: 12/06/2018 Delivering MD: Noland Fordyce   Date of discharge: 12/08/2018  Admitting diagnosis:  34WKS LEAKING FLUID Intrauterine pregnancy: [redacted]w[redacted]d     Secondary diagnosis:Principal Problem:   Postpartum care following VBAC (3/20) Active Problems:   PROM (premature rupture of membranes)   Second degree perineal laceration  Additional problems: Retained placenta needing manual extraction and IV nitroglycerin     Discharge diagnosis: Preterm premature rupture membranes, vaginal birth after cesarean section, retained placenta with manual extraction                                                                     Post partum procedures:Manual extraction of retained placenta  Augmentation: None  Complications: None  Hospital course:  Onset of Labor With Vaginal Delivery     38 y.o. yo [id number] at [redacted]w[redacted]d was admitted with preterm premature rupture of membranes.  No evidence of infection and after long discussion patient decided on expectant management.  Betamethasone was given, Procardia tocolyse is x1 was given, latency antibiotics was given.  Later that day however active labor ensued.  Again discussion with patient was had regarding mode of delivery and given active labor trial of labor after cesarean section was decided upon.  Patient was opting for an epidural however due to staffing delay in getting patient to the labor floor patient was unable to receive an epidural.  Patient had an uncomplicated vaginal delivery.  After 30 minutes placenta did not deliver.  Pain medicines antibiotics and IV nitroglycerin were given and manual extraction of the placenta was successful.  Postpartum course was uncomplicated   Membrane Rupture Time/Date: 6:30 AM ,12/07/2018   Intrapartum Procedures: Episiotomy: None [1]                                         Lacerations:     Patient had a  delivery of a Viable infant. 12/06/2018  Information for the patient's newborn:  Perrie, Bascue [[id number]]       Pateint had an uncomplicated postpartum course.  She is ambulating, tolerating a regular diet, passing flatus, and urinating well. Patient is discharged home in stable condition on 12/08/18.   Physical exam  Vitals:   12/07/18 1627 12/07/18 1930 12/08/18 0450 12/08/18 0755  BP: 110/62 114/73 113/73 103/61  Pulse: 87 70 79 79  Resp: 16 17  16   Temp: 98.7 F (37.1 C) 98.9 F (37.2 C) 98.9 F (37.2 C) 98.6 F (37 C)  TempSrc: Oral Oral Oral Oral  SpO2: 99% 99% 99% 99%  Weight:      Height:       General: alert, cooperative and no distress Lochia: appropriate Uterine Fundus: firm Incision: N/A DVT Evaluation: No evidence of DVT seen on physical exam. Labs: Lab Results  Component Value Date   WBC 12.7 (H) 12/06/2018   HGB 12.3 12/06/2018   HCT 36.6 12/06/2018   MCV 91.3 12/06/2018   PLT 211 12/06/2018   No flowsheet data found.  Discharge instruction: per After Visit Summary and "Baby and  Me Booklet".  After Visit Meds:  Allergies as of 12/08/2018      Reactions   Monistat [miconazole] Swelling   Latex Rash      Medication List    TAKE these medications   ibuprofen 600 MG tablet Commonly known as:  ADVIL,MOTRIN Take 1 tablet (600 mg total) by mouth every 6 (six) hours.   oxyCODONE 5 MG immediate release tablet Commonly known as:  Oxy IR/ROXICODONE Take 1 tablet (5 mg total) by mouth every 4 (four) hours as needed (pain scale 4-7).   prenatal multivitamin Tabs tablet Take 1 tablet by mouth daily at 12 noon.       Diet: routine diet  Activity: Advance as tolerated. Pelvic rest for 6 weeks.   Outpatient follow up:6 weeks Follow up Appt:No future appointments. Follow up visit: No follow-ups on file.  Postpartum contraception: Not Discussed  Newborn Data: Live born female  Birth Weight: 5 lb 1.1 oz (2300 g) APGAR: 9, 9  Newborn  Delivery   Birth date/time:  12/06/2018 22:28:00 Delivery type:  Vaginal, Spontaneous     Baby Feeding: Pumping Disposition:NICU   12/08/2018 Lendon Colonel, MD

## 2018-12-08 NOTE — Progress Notes (Signed)
Post Partum Day 2 S/P SVD/VBAC  Feeding: breast Subjective: No HA, SOB, CP, F/C, breast symptoms. Normal vaginal bleeding, no clots. Pain controlled.  ambulating without symptoms.  voiding without difficulty .  Patient states very happy with decision for VBAC.  Patient states recovery is much easier than prior  Objective: BP 103/61 (BP Location: Left Arm)   Pulse 79   Temp 98.6 F (37 C) (Oral)   Resp 16   Ht 5\' 7"  (1.702 m)   Wt 63.5 kg   SpO2 99%   Breastfeeding Unknown   BMI 21.93 kg/m  I&O reviewed.   Physical Exam:  General: alert and cooperative Lochia: appropriate Uterine Fundus: firm DVT Evaluation: No evidence of DVT seen on physical exam. Ext: No c/c/e Recent Labs    12/06/18 1152  HGB 12.3  HCT 36.6      Assessment/Plan: 38 y.o.  PPD #2 .  normal postpartum exam Continue current postpartum care Ambulate Discharge home later today Baby in NICU on CPAP   LOS: 2 days   Lendon Colonel 12/08/2018 10:57 AM

## 2018-12-13 ENCOUNTER — Ambulatory Visit: Payer: Self-pay

## 2018-12-13 NOTE — Lactation Note (Signed)
This note was copied from a baby's chart. Lactation Consultation Note  Patient Name: Kelsey Cox YBRKV'T Date: 12/13/2018 Reason for consult: Follow-up assessment;NICU baby;Late-preterm 34-36.6wks Called for feeding assist in the NICU.  Baby is now 26 days old and 35.3 PMA.  Mom is pumping every 3 hours and obtaining 90-120 mls each pumping.  Baby started 72 hours breastfeeding trial yesterday.  He is receiving all NG feeds at this time.  Discussion with mom about late preterm feeding expectations.  Stressed importance of patience with baby and expecting feeding inconsistency until at least term.  Baby has had one good 30 minute feeding per mom but he has been sleepy with the rest of feeds.  Reassured this is normal.  Baby is currently sleeping.  Mom changed diaper and he became alert.  Mom easily positioned baby in cross cradle hold.  She is using a nipple shield because she did with her first baby.  Nipples erect but shield may still be beneficial with prematurity.  Mom easily hand expressed milk onto baby's lips.  Baby became very sleepy and not opening mouth.  Mom attempted to latch baby for 15 minutes but baby showing no interest.  Recommended mom attempt latch 3-4 times per day but sleep at night and only wake to pump.  Encouraged to call when more assist desired.  Lactation outpatient consultation recommended after discharge.  Maternal Data    Feeding Feeding Type: Breast Fed  LATCH Score Latch: Too sleepy or reluctant, no latch achieved, no sucking elicited.  Audible Swallowing: None  Type of Nipple: Everted at rest and after stimulation  Comfort (Breast/Nipple): Soft / non-tender  Hold (Positioning): No assistance needed to correctly position infant at breast.  LATCH Score: 6  Interventions    Lactation Tools Discussed/Used Tools: Nipple Shields Nipple shield size: 20   Consult Status Consult Status: PRN Follow-up type: Call as needed    [name] 12/13/2018, 12:27 PM

## 2018-12-19 ENCOUNTER — Ambulatory Visit: Payer: Self-pay

## 2018-12-19 NOTE — Lactation Note (Signed)
This note was copied from a baby's chart. Lactation Consultation Note  Patient Name: Kelsey Cox PVVZS'M Date: 12/19/2018 Reason for consult: Late-preterm 34-36.6wks;Follow-up assessment;Mother's request;Initial assessment  Visited with mom of a 40 days old LPI female who is being fed breastmilk through gavage, bottles and feeding at the breast. There is a pending discharge for mom and baby, they're most likely going home either tomorrow or Saturday morning. Mom is pumping about 100 ml combined on each pumping session but she's not doing it every 3 hours. Explained to mom the importance of consistent pumping, mom has a 4 y.o at home and she's also taking care of her mother who is a cancer patient. Provided reassurance that things will work out, and that even if she can consistently pump during the day and just put baby to the breast at night, she can still build a full supply.   Offered assistance with latch, and mom agreed to wake baby up to feed, LC took baby in cross cradle hold to mom's left breast, she was wearing a NS and baby was able to latch at the third attempt (LC had to repositioned shield and get it wet). Mom told LC that baby usually have his "best" feedings in the morning and early afternoon but that the night feedings are not really the best because he's very sleepy. He was able to transfer some, with a few loud audible swallows heard (gulping) but baby kept falling asleep, on and off during the 10 minutes feeding. At the end when he self-released from the breast, LC had to gently insert her finger in baby's mouth in order for him to swallow because he had several drops of milk leaking out of his mouth and on the NS.   Mom is still on the fence about her discharge, she wants to go home, but she doesn't feel like baby is ready. Reassured mom about LC OP consultation and that the final decision will be up to NICU staff; but at the moment lactation feels like if this baby is going home,  he'll need to be supplemented after every single feeding until he can completely empty the breast on his own. Mom is worried because she doesn't have a freezer full of milk waiting for baby at home, but she'll start pumping consistently at least during the day time to build up her supply; praised her for her efforts. Revised LPI guidelines, mom understands that feedings at the breast won't be for more than 30 minutes at the time and that current feeding plan will be revised at discharge and then later on baby's next pediatrician appointment.  Feeding plan:  1. Encouraged mom to keep feeding baby STS every 3 hours or sooner after feedings 2. Mom will supplement baby after feedings at the breast starting with 30 ml of her EBM per LPI policy, volume will be adjusted per NICU staff at discharge 3. Mom will start pumping consistently every 3 hours after feedings at least during the daytime  Mom reported all questions and concerns were answered, she's aware of LC services and will call PRN. She'll request to her NICU RN be seen again by lactation the day of her actual discharge.  Maternal Data    Feeding Feeding Type: Breast Fed  LATCH Score Latch: Repeated attempts needed to sustain latch, nipple held in mouth throughout feeding, stimulation needed to elicit sucking reflex.(with NS)  Audible Swallowing: A few with stimulation(with breast compressions)  Type of Nipple: Everted at rest and  after stimulation  Comfort (Breast/Nipple): Soft / non-tender  Hold (Positioning): Assistance needed to correctly position infant at breast and maintain latch.(minimal assistance, but still needed)  LATCH Score: 7  Interventions Interventions: Breast feeding basics reviewed;Assisted with latch;Breast massage;Breast compression;Hand express;Adjust position;Support pillows  Lactation Tools Discussed/Used     Consult Status Consult Status: Follow-up Date: 12/20/18 Follow-up type:  In-patient    Kelsey Cox 12/19/2018, 9:31 PM

## 2018-12-20 DIAGNOSIS — Z412 Encounter for routine and ritual male circumcision: Secondary | ICD-10-CM | POA: Diagnosis not present

## 2019-01-16 DIAGNOSIS — Z124 Encounter for screening for malignant neoplasm of cervix: Secondary | ICD-10-CM | POA: Diagnosis not present

## 2019-08-07 IMAGING — US US MFM OB DETAIL +14 WK
1 series · 13 of 28 positions shown · non-contrast
Comparison: none

[Series 1: us mfm ob detail +14 wk · 62 acquisitions, 13 frames shown]
[im 3/62]
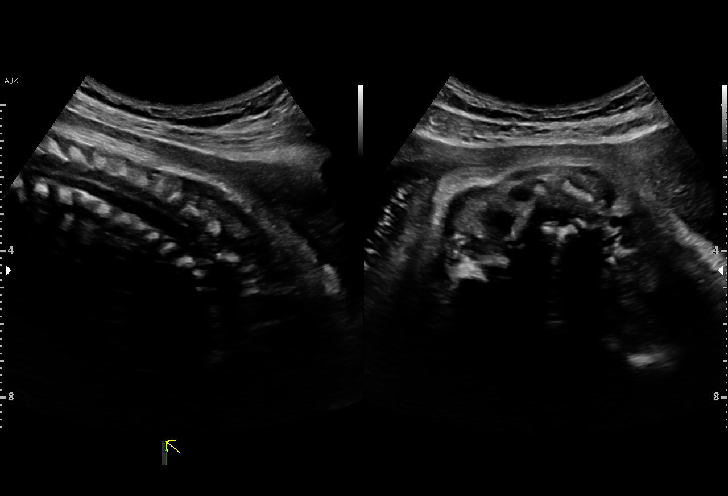
[im 7/62]
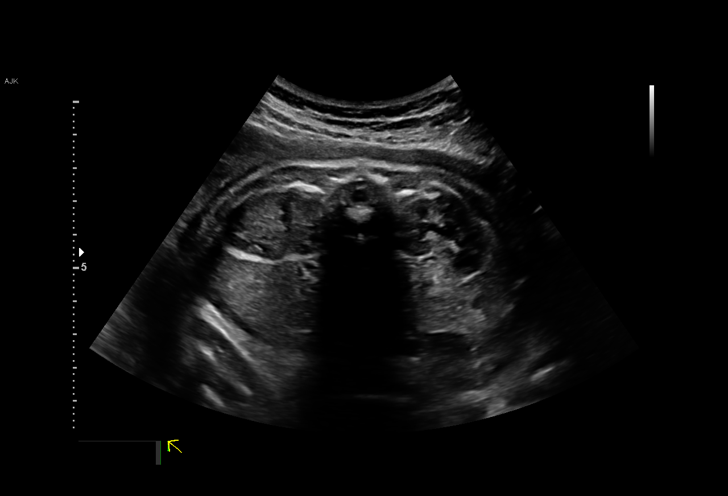
[im 12/62]
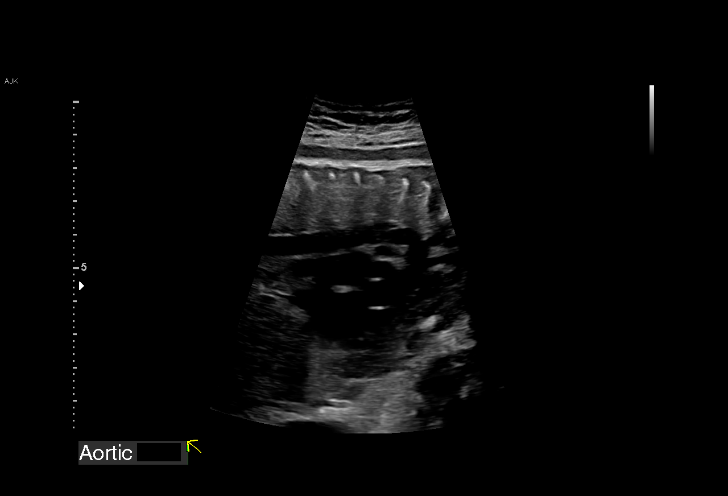
[im 16/62]
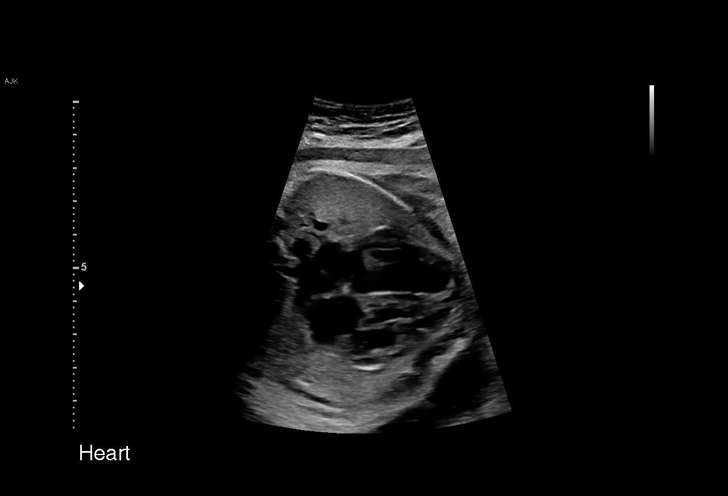
[im 21/62]
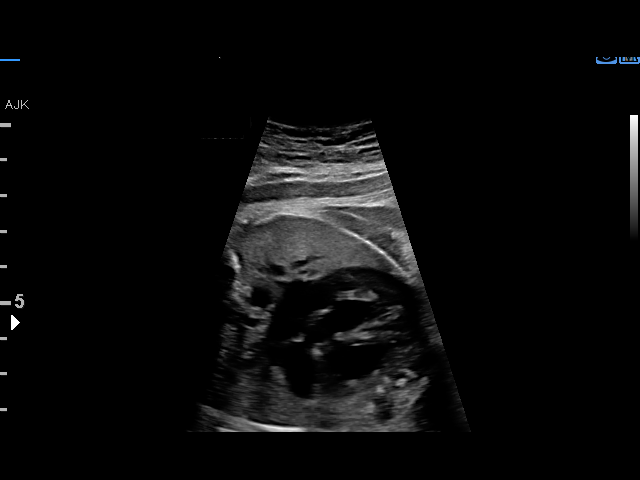
[im 25/62]
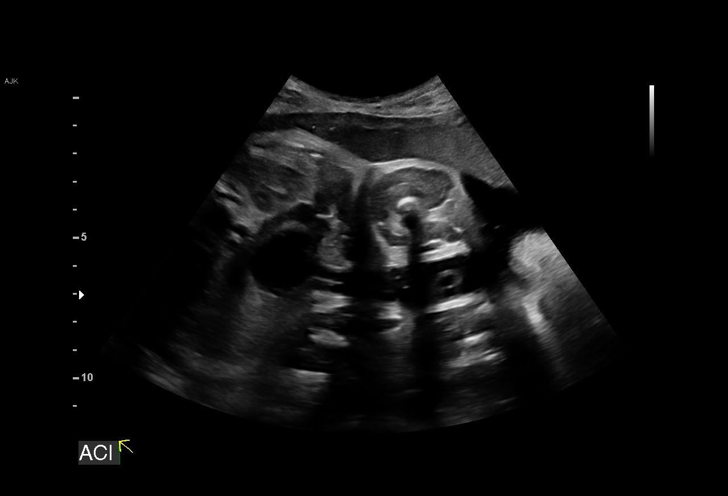
[im 32/62]
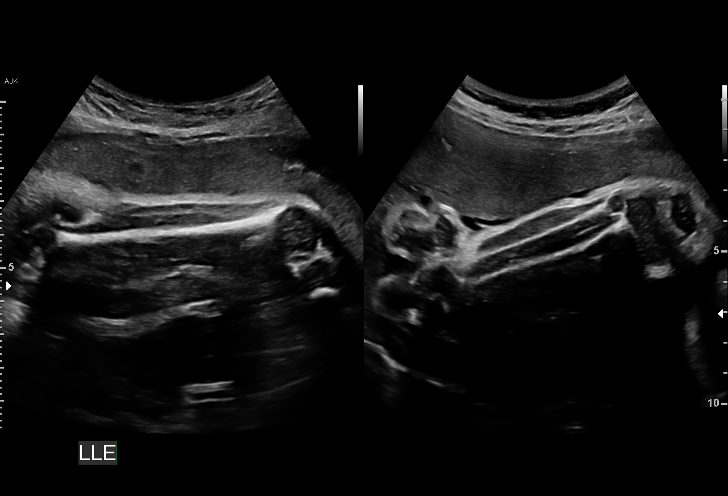
[im 37/62]
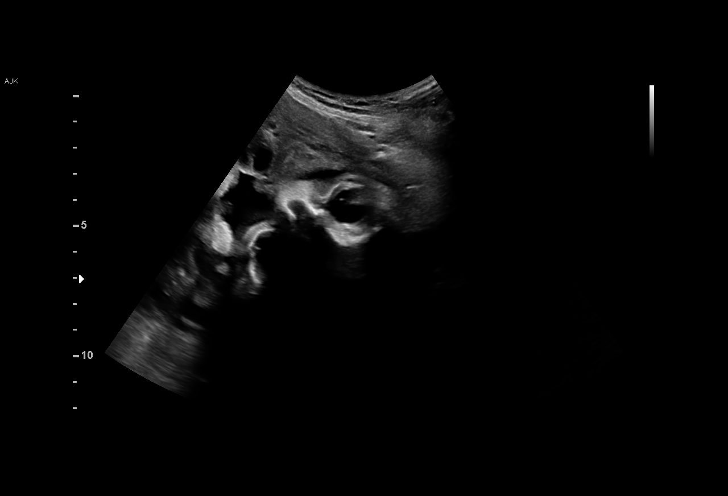
[im 41/62]
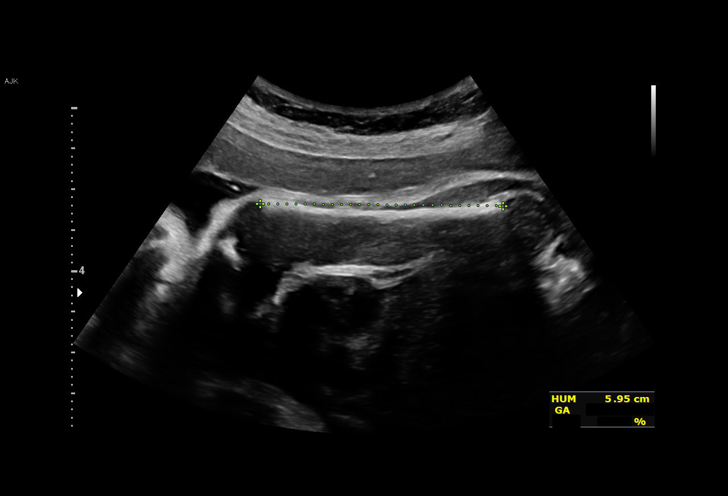
[im 46/62]
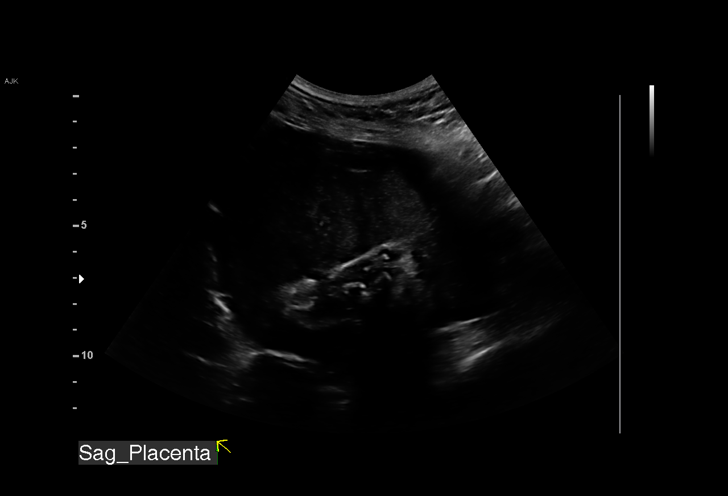
[im 50/62]
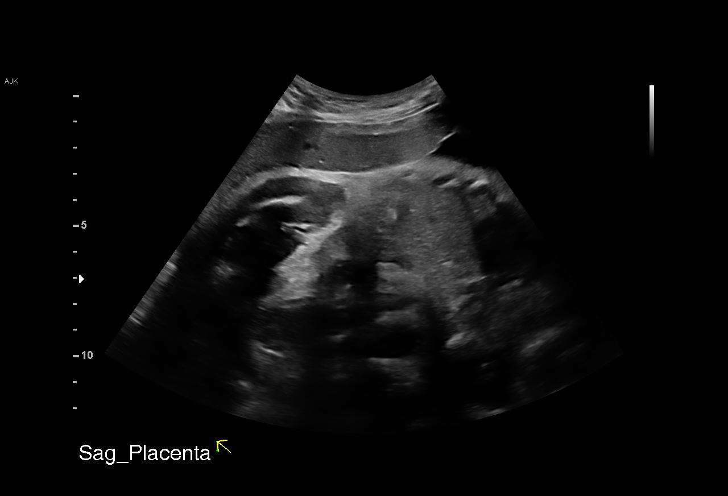
[im 55/62]
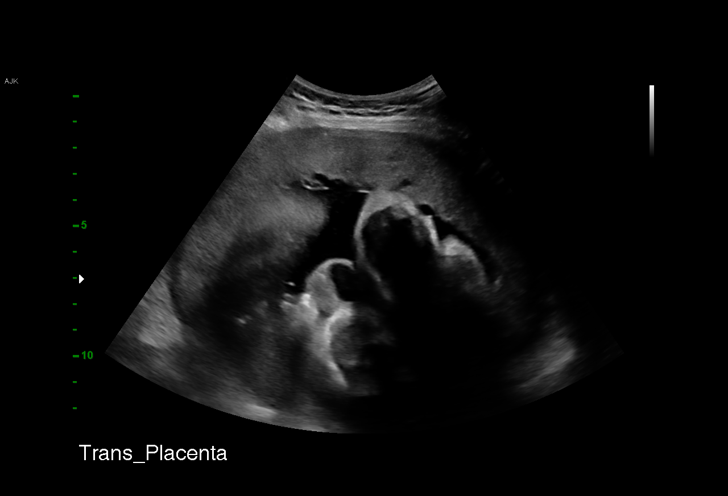
[im 59/62]
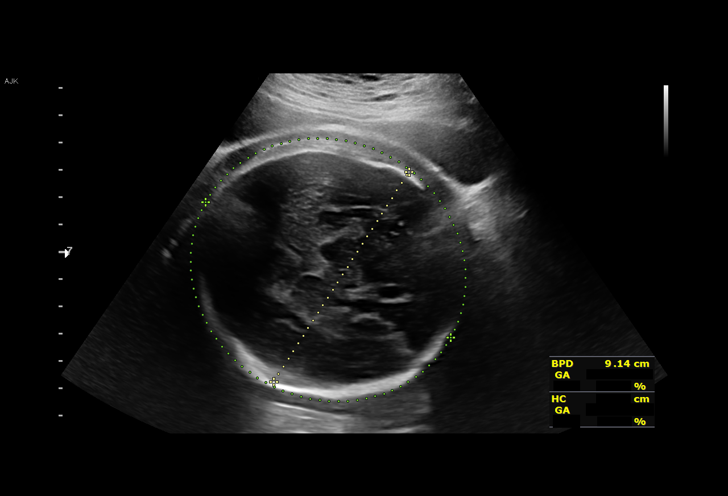

[13 of 28 positions shown; findings below may reference images not displayed]

----------------------------------------------------------------------

 ----------------------------------------------------------------------
Indications

  Advanced maternal age multigravida 35+,
  third trimester
  Uterine size-date discrepancy, third trimester
  Premature rupture of membranes - leaking
  fluid
  Encounter for antenatal screening for
  malformations
  34 weeks gestation of pregnancy
 ----------------------------------------------------------------------
Fetal Evaluation

 Num Of Fetuses:         1
 Fetal Heart Rate(bpm):  153
 Cardiac Activity:       Observed
 Presentation:           Cephalic
 Placenta:               Anterior
 P. Cord Insertion:      Visualized, central

 Amniotic Fluid
 AFI FV:      Within normal limits

 AFI Sum(cm)     %Tile       Largest Pocket(cm)
 11.2            29

 RUQ(cm)       RLQ(cm)       LUQ(cm)        LLQ(cm)


 Comment:    No placental abruption or previa identified.
Biometry

 BPD:      91.4  mm     G. Age:  37w 1d         98  %    CI:        86.55   %    70 - 86
                                                         FL/HC:      21.4   %    19.4 -
 HC:      309.3  mm     G. Age:  34w 4d         20  %    HC/AC:      1.06        0.96 -
 AC:      292.7  mm     G. Age:  33w 2d         23  %    FL/BPD:     72.3   %    71 - 87
 FL:       66.1  mm     G. Age:  34w 0d         31  %    FL/AC:      22.6   %    20 - 24
 HUM:      59.2  mm     G. Age:  34w 2d         59  %

 Est. FW:    7553  gm      5 lb 2 oz     51  %
OB History

 Gravidity:    3         Term:   1         SAB:   1
Gestational Age

 Clinical EDD:  34w 3d                                        EDD:   01/14/19
 U/S Today:     34w 5d                                        EDD:   01/12/19
 Best:          34w 3d     Det. By:  Clinical EDD             EDD:   01/14/19
Anatomy

 Cranium:               Appears normal         Aortic Arch:            Appears normal
 Cavum:                 Not well visualized    Ductal Arch:            Not well visualized
 Ventricles:            Not well visualized    Diaphragm:              Appears normal
 Choroid Plexus:        Not well visualized    Stomach:                Appears normal, left
                                                                       sided
 Cerebellum:            Not well visualized    Abdomen:                Appears normal
 Posterior Fossa:       Not well visualized    Abdominal Wall:         Appears nml (cord
                                                                       insert, abd wall)
 Nuchal Fold:           Not applicable (>20    Cord Vessels:           Appears normal (3
                        wks GA)                                        vessel cord)
 Face:                  Appears normal         Kidneys:                Appear normal
                        (orbits and profile)
 Lips:                  Appears normal         Bladder:                Appears normal
 Thoracic:              Appears normal         Spine:                  Appears normal
 Heart:                 Appears normal         Upper Extremities:      NWS
                        (4CH, axis, and
                        situs)
 RVOT:                  Appears normal         Lower Extremities:      Appears normal
 LVOT:                  Appears normal

 Other:  Technically difficult due to fetal position.
Impression

 Patient is being evaluated at the [facility] for c/o leakage of
 amniotic fluid.
 Fetal growth is consistent with her previously-established
 dates. Amniotic fluid is normal and good fetal activity is seen.
 Fetal anatomy appears normal, but limited by advanced
 gestational age. Placenta appears normal with no previa or
 abruption.
Recommendations

 -Decision to deliver should be based on clinical diagnosis of
 PPROM. Amniotic fluid may be normal on ultrasound in
 patients with PPROM.
                 Carranco, Renita

## 2020-02-06 DIAGNOSIS — Z01419 Encounter for gynecological examination (general) (routine) without abnormal findings: Secondary | ICD-10-CM | POA: Diagnosis not present

## 2020-02-06 DIAGNOSIS — Z1231 Encounter for screening mammogram for malignant neoplasm of breast: Secondary | ICD-10-CM | POA: Diagnosis not present

## 2020-02-06 DIAGNOSIS — Z6820 Body mass index (BMI) 20.0-20.9, adult: Secondary | ICD-10-CM | POA: Diagnosis not present

## 2020-03-09 DIAGNOSIS — K640 First degree hemorrhoids: Secondary | ICD-10-CM | POA: Diagnosis not present

## 2020-03-09 DIAGNOSIS — Z800 Family history of malignant neoplasm of digestive organs: Secondary | ICD-10-CM | POA: Diagnosis not present

## 2020-03-09 DIAGNOSIS — K625 Hemorrhage of anus and rectum: Secondary | ICD-10-CM | POA: Diagnosis not present

## 2020-03-09 DIAGNOSIS — K5904 Chronic idiopathic constipation: Secondary | ICD-10-CM | POA: Diagnosis not present

## 2020-06-09 DIAGNOSIS — Z1211 Encounter for screening for malignant neoplasm of colon: Secondary | ICD-10-CM | POA: Diagnosis not present

## 2020-06-09 DIAGNOSIS — K625 Hemorrhage of anus and rectum: Secondary | ICD-10-CM | POA: Diagnosis not present

## 2020-06-09 DIAGNOSIS — Z800 Family history of malignant neoplasm of digestive organs: Secondary | ICD-10-CM | POA: Diagnosis not present

## 2020-11-03 DIAGNOSIS — F4322 Adjustment disorder with anxiety: Secondary | ICD-10-CM | POA: Diagnosis not present

## 2020-11-15 DIAGNOSIS — F4322 Adjustment disorder with anxiety: Secondary | ICD-10-CM | POA: Diagnosis not present

## 2020-11-24 DIAGNOSIS — H524 Presbyopia: Secondary | ICD-10-CM | POA: Diagnosis not present

## 2020-11-24 DIAGNOSIS — H33322 Round hole, left eye: Secondary | ICD-10-CM | POA: Diagnosis not present

## 2020-11-24 DIAGNOSIS — H35412 Lattice degeneration of retina, left eye: Secondary | ICD-10-CM | POA: Diagnosis not present

## 2020-11-24 DIAGNOSIS — H43393 Other vitreous opacities, bilateral: Secondary | ICD-10-CM | POA: Diagnosis not present

## 2020-11-25 NOTE — Progress Notes (Signed)
[facility] - Clinic Note  11/29/2020     CHIEF COMPLAINT Patient presents for Retina Evaluation   HISTORY OF PRESENT ILLNESS: Kelsey Cox is a 39 y.o. female who presents to the clinic today for:   HPI    Retina Evaluation    In left eye.  This started 10 years ago.  Duration of 10 years.  Associated Symptoms Floaters.  I, the attending physician,  performed the HPI with the patient and updated documentation appropriately.          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  Pt states she has known about retinal hole/tear OS for years--has Hx with Dr. Luciana Axe.  Pt states she has had a few floaters in her left eye but no flashes of light.  Pt denies floaters or fol OD.       Last edited by Rennis Chris, MD on 11/29/2020 12:11 PM. (History)    pt states 10-15 years ago she developed floaters while on vacation, she states she went to [facility] at the time and was told that she has a small hole in her left eye, pt states she saw Dr. Luciana Axe at the time and he followed her for awhile, but then released her, pt states she recently started seeing new floaters and went to see Dr. Alben Spittle, she states he said it wouldn't hurt to get the hole checked out again  Referring physician: Dimitri Ped, MD [address] [place],  [place] [address]  HISTORICAL INFORMATION:   Selected notes from the MEDICAL RECORD NUMBER Referred by Dr. Alben Spittle for eval of lattice/retinal holes LEE:  Ocular Hx- PMH-    CURRENT MEDICATIONS: Current Outpatient Medications (Ophthalmic Drugs)  Medication Sig  . prednisoLONE acetate (PRED FORTE) 1 % ophthalmic suspension Place 1 drop into the left eye 4 (four) times daily for 7 days.   No current facility-administered medications for this visit. (Ophthalmic Drugs)   Current Outpatient Medications (Other)  Medication Sig  . ibuprofen (ADVIL,MOTRIN) 600 MG tablet Take 1  tablet (600 mg total) by mouth every 6 (six) hours.  Marland Kitchen oxyCODONE (OXY IR/ROXICODONE) 5 MG immediate release tablet Take 1 tablet (5 mg total) by mouth every 4 (four) hours as needed (pain scale 4-7).  . Prenatal Vit-Fe Fumarate-FA (PRENATAL MULTIVITAMIN) TABS tablet Take 1 tablet by mouth daily at 12 noon.   No current facility-administered medications for this visit. (Other)      REVIEW OF SYSTEMS: ROS    Positive for: Eyes   Negative for: Constitutional, Gastrointestinal, Neurological, Skin, Genitourinary, Musculoskeletal, HENT, Endocrine, Cardiovascular, Respiratory, Psychiatric, Allergic/Imm, Heme/Lymph   Last edited by Corrinne Eagle on 11/29/2020  9:06 AM. (History)       ALLERGIES Allergies  Allergen Reactions  . Monistat [Miconazole] Swelling  . Latex Rash    PAST MEDICAL HISTORY Past Medical History:  Diagnosis Date  . Euthyroid goiter   . 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 Renal stone    age 33, passed  . UTI (urinary tract infection)   . Yeast infection    chronic   Past Surgical History:  Procedure Laterality Date  . CESAREAN SECTION N/A 06/01/2014   Procedure: CESAREAN SECTION;  Surgeon: Lenoard Aden, MD;  Location: [facility];  Service: Obstetrics;  Laterality: N/A;  . HYSTEROSCOPY     with polyp resection  . WISDOM TOOTH EXTRACTION  FAMILY HISTORY Family History  Problem Relation Age of Onset  . Diabetes Father   . Hypertension Father   . Breast cancer Mother [id number]  . Diabetes Paternal Grandmother   . Cancer Paternal Grandmother        GI cancer, lung cancer  . Breast cancer Maternal Grandmother [id number]  . Heart attack Paternal Grandfather     SOCIAL HISTORY Social History   Tobacco Use  . Smoking status: Never Smoker  . Smokeless tobacco: Never Used  Vaping Use  . Vaping Use: Never used  Substance Use Topics  . Alcohol use: Not Currently    Alcohol/week: 7.0 standard drinks    Types: 7 Standard drinks or equivalent per week    Comment:  NONE WHILE PREG  . Drug use: No         OPHTHALMIC EXAM:  Base Eye Exam    Visual Acuity (Snellen - Linear)      Right Left   Dist [place] 20/20 -1 20/20 -2       Tonometry (Tonopen, 9:18 AM)      Right Left   Pressure 21 20       Pupils      Dark Light Shape React APD   Right 3 2 Round Brisk 0   Left 3 2 Round Brisk 0       Visual Fields      Left Right    Full Full       Extraocular Movement      Right Left    Full Full       Neuro/Psych    Oriented x3: Yes   Mood/Affect: Normal       Dilation    Both eyes: 1.0% Mydriacyl, 2.5% Phenylephrine @ 9:18 AM        Slit Lamp and Fundus Exam    Slit Lamp Exam      Right Left   Lids/Lashes Normal Normal   Conjunctiva/Sclera White and quiet White and quiet   Cornea Clear Clear   Anterior Chamber Deep and quiet Deep and quiet   Iris Round and dilated Round and dilated   Lens Clear Clear   Vitreous Vitreous syneresis Vitreous syneresis       Fundus Exam      Right Left   Disc Pink and Sharp Pink and Sharp   C/D Ratio 0.4 0.3   Macula Flat, Good foveal reflex, RPE mottling, No heme or edema Flat, Good foveal reflex, RPE mottling, No heme or edema   Vessels Normal Normal   Periphery Attached, VR tuft at 1030 equator    Attached, superior lattice from 1230-0130 with multiple atrophic holes, round hole at 1230 with trace cuff of SRF, focal patch of lattice at 0300          IMAGING AND PROCEDURES  Imaging and Procedures for 11/29/2020  OCT, Retina - OU - Both Eyes       Right Eye Quality was good. Central Foveal Thickness: 277. Progression has no prior data. Findings include normal foveal contour, no IRF, no SRF, vitreomacular adhesion , retinal drusen  (Rare drusen).   Left Eye Quality was good. Central Foveal Thickness: 282. Progression has no prior data. Findings include normal foveal contour, no IRF, no SRF, vitreomacular adhesion .   Notes *Images captured and stored on drive  Diagnosis /  Impression:  NFP, no IRF/SRF OU OD with rare retinal drusen  Clinical management:  See below  Abbreviations: NFP - Normal foveal profile.  CME - cystoid macular edema. PED - pigment epithelial detachment. IRF - intraretinal fluid. SRF - subretinal fluid. EZ - ellipsoid zone. ERM - epiretinal membrane. ORA - outer retinal atrophy. ORT - outer retinal tubulation. SRHM - subretinal hyper-reflective material. IRHM - intraretinal hyper-reflective material        Repair Retinal Breaks, Laser - OS - Left Eye       LASER PROCEDURE NOTE  Procedure:  Barrier laser retinopexy using slit lamp laser, LEFT eye   Diagnosis:   Lattice degeneration w/ atrophic holes, LEFT eye                     Patches of lattice: 1230-0130 superiorly, and 0300  Surgeon: Rennis Chris, MD, PhD  Anesthesia: Topical  Informed consent obtained, operative eye marked, and time out performed prior to initiation of laser.   Laser settings:  Lumenis [id number] laser, slit lamp Lens: Mainster PRP 165 Power: 250 mW Spot size: 200 microns Duration: 30 msec  # spots: 542  Placement of laser: Using a Mainster PRP 165 contact lens at the slit lamp, laser was placed in three confluent rows around patch of lattice w/ atrophic holes from 1230-0130 and at 0300 oclock anterior to equator with additional rows anteriorly.  Complications: None.  Patient tolerated the procedure well and received written and verbal post-procedure care information/education.               ASSESSMENT/PLAN:    ICD-10-CM   1. Lattice degeneration of left retina  H35.412 Repair Retinal Breaks, Laser - OS - Left Eye  2. Retinal holes, left  H33.322 Repair Retinal Breaks, Laser - OS - Left Eye  3. Retinal edema  H35.81 OCT, Retina - OU - Both Eyes    1,2. Lattice degeneration w/ atrophic holes, left eye - superior lattice from 1230-0130 with multiple atrophic holes, round hole at 1230 with trace cuff of SRF; small focal patch of lattice at  0300 - discussed findings, prognosis, and treatment options including observation - recommend laser retinopexy OS today, 03.14.22 - pt wishes to proceed with laser - RBA of procedure discussed, questions answered - informed consent obtained and signed - see procedure note - start PF QID OS x7 days - f/u in 2 wks, sooner prn -- POV  3. No retinal edema on exam or OCT   Ophthalmic Meds Ordered this visit:  Meds ordered this encounter  Medications  . prednisoLONE acetate (PRED FORTE) 1 % ophthalmic suspension    Sig: Place 1 drop into the left eye 4 (four) times daily for 7 days.    Dispense:  10 mL    Refill:  0       Return in about 2 weeks (around 12/13/2020) for f/u lattice degeneration OS, DFE, OCT.  There are no Patient Instructions on file for this visit.   Explained the diagnoses, plan, and follow up with the patient and they expressed understanding.  Patient expressed understanding of the importance of proper follow up care.   This document serves as a record of services personally performed by Karie Chimera, MD, PhD. It was created on their behalf by Glee Arvin. Manson Passey, OA an ophthalmic technician. The creation of this record is the provider's dictation and/or activities during the visit.    Electronically signed by: Glee Arvin. Manson Passey, [place] 03.14.2022 12:20 PM   Karie Chimera, M.D., Ph.D. Diseases & Surgery of the Retina and Vitreous [facility]  I have  reviewed the above documentation for accuracy and completeness, and I agree with the above. Karie Chimera, M.D., Ph.D. 11/29/20 12:20 PM   Abbreviations: M myopia (nearsighted); A astigmatism; H hyperopia (farsighted); P presbyopia; Mrx spectacle prescription;  CTL contact lenses; OD right eye; OS left eye; OU both eyes  XT exotropia; ET esotropia; PEK punctate epithelial keratitis; PEE punctate epithelial erosions; DES dry eye syndrome; MGD meibomian gland dysfunction; ATs artificial tears;  PFAT's preservative free artificial tears; NSC nuclear sclerotic cataract; PSC posterior subcapsular cataract; ERM epi-retinal membrane; PVD posterior vitreous detachment; RD retinal detachment; DM diabetes mellitus; DR diabetic retinopathy; NPDR non-proliferative diabetic retinopathy; PDR proliferative diabetic retinopathy; CSME clinically significant macular edema; DME diabetic macular edema; dbh dot blot hemorrhages; CWS cotton wool spot; POAG primary open angle glaucoma; C/D cup-to-disc ratio; HVF humphrey visual field; GVF goldmann visual field; OCT optical coherence tomography; IOP intraocular pressure; BRVO Branch retinal vein occlusion; CRVO central retinal vein occlusion; CRAO central retinal artery occlusion; BRAO branch retinal artery occlusion; RT retinal tear; SB scleral buckle; PPV pars plana vitrectomy; VH Vitreous hemorrhage; PRP panretinal laser photocoagulation; IVK intravitreal kenalog; VMT vitreomacular traction; MH Macular hole;  NVD neovascularization of the disc; NVE neovascularization elsewhere; AREDS age related eye disease study; ARMD age related macular degeneration; POAG primary open angle glaucoma; EBMD epithelial/anterior basement membrane dystrophy; ACIOL anterior chamber intraocular lens; IOL intraocular lens; PCIOL posterior chamber intraocular lens; Phaco/IOL phacoemulsification with intraocular lens placement; PRK photorefractive keratectomy; LASIK laser assisted in situ keratomileusis; HTN hypertension; DM diabetes mellitus; COPD chronic obstructive pulmonary disease

## 2020-11-29 ENCOUNTER — Other Ambulatory Visit: Payer: Self-pay

## 2020-11-29 ENCOUNTER — Ambulatory Visit (INDEPENDENT_AMBULATORY_CARE_PROVIDER_SITE_OTHER): Payer: BC Managed Care – PPO | Admitting: Ophthalmology

## 2020-11-29 ENCOUNTER — Encounter (INDEPENDENT_AMBULATORY_CARE_PROVIDER_SITE_OTHER): Payer: Self-pay | Admitting: Ophthalmology

## 2020-11-29 DIAGNOSIS — H35412 Lattice degeneration of retina, left eye: Secondary | ICD-10-CM

## 2020-11-29 DIAGNOSIS — H33322 Round hole, left eye: Secondary | ICD-10-CM

## 2020-11-29 DIAGNOSIS — H3581 Retinal edema: Secondary | ICD-10-CM | POA: Diagnosis not present

## 2020-11-29 MED ORDER — PREDNISOLONE ACETATE 1 % OP SUSP: 1.0000 [drp] | Freq: Four times a day (QID) | OPHTHALMIC | 0 refills | Status: AC

## 2020-12-08 DIAGNOSIS — Z1331 Encounter for screening for depression: Secondary | ICD-10-CM | POA: Diagnosis not present

## 2020-12-08 DIAGNOSIS — Z0000 Encounter for general adult medical examination without abnormal findings: Secondary | ICD-10-CM | POA: Diagnosis not present

## 2020-12-08 DIAGNOSIS — Z1389 Encounter for screening for other disorder: Secondary | ICD-10-CM | POA: Diagnosis not present

## 2020-12-14 NOTE — Progress Notes (Signed)
[facility] - Clinic Note  12/15/2020     CHIEF COMPLAINT Patient presents for Retina Follow Up   HISTORY OF PRESENT ILLNESS: Kelsey Cox is a 40 y.o. female who presents to the clinic today for:   HPI    Retina Follow Up    Patient presents with  Other.  In left eye.  This started 2 weeks ago.  Severity is mild.  Duration of 2 weeks.  Since onset it is stable.  I, the attending physician,  performed the HPI with the patient and updated documentation appropriately.          Comments    40 y/o female pt here for 2 wk f/u s/p laser retinopexy for lattice w/atrophic holes OS.  No change in [place] OU.  Denies pain, FOL, floaters, but has been noticing having more headaches over her left eye.  No gtts.       Last edited by Rennis Chris, MD on 12/15/2020  1:38 PM. (History)    pt states no problems after laser procedure, she is noticing her floaters less   Referring physician: Dimitri Ped, MD [address] [place],  [place] [address]  HISTORICAL INFORMATION:   Selected notes from the MEDICAL RECORD NUMBER Referred by Dr. Alben Spittle for eval of lattice/retinal holes   CURRENT MEDICATIONS: No current outpatient medications on file. (Ophthalmic Drugs)   No current facility-administered medications for this visit. (Ophthalmic Drugs)   Current Outpatient Medications (Other)  Medication Sig  . ibuprofen (ADVIL,MOTRIN) 600 MG tablet Take 1 tablet (600 mg total) by mouth every 6 (six) hours.  Marland Kitchen oxyCODONE (OXY IR/ROXICODONE) 5 MG immediate release tablet Take 1 tablet (5 mg total) by mouth every 4 (four) hours as needed (pain scale 4-7).  . Prenatal Vit-Fe Fumarate-FA (PRENATAL MULTIVITAMIN) TABS tablet Take 1 tablet by mouth daily at 12 noon.   No current facility-administered medications for this visit. (Other)      REVIEW OF SYSTEMS: ROS    Positive for: Genitourinary, Eyes   Negative for: Constitutional, Gastrointestinal, Neurological,  Skin, Musculoskeletal, HENT, Endocrine, Cardiovascular, Respiratory, Psychiatric, Allergic/Imm, Heme/Lymph   Last edited by Celine Mans, COA on 12/15/2020  9:04 AM. (History)       ALLERGIES Allergies  Allergen Reactions  . Monistat [Miconazole] Swelling  . Latex Rash    PAST MEDICAL HISTORY Past Medical History:  Diagnosis Date  . Euthyroid goiter   . 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 Renal stone    age 78, passed  . UTI (urinary tract infection)   . Yeast infection    chronic   Past Surgical History:  Procedure Laterality Date  . CESAREAN SECTION N/A 06/01/2014   Procedure: CESAREAN SECTION;  Surgeon: Lenoard Aden, MD;  Location: [facility];  Service: Obstetrics;  Laterality: N/A;  . HYSTEROSCOPY     with polyp resection  . WISDOM TOOTH EXTRACTION      FAMILY HISTORY Family History  Problem Relation Age of Onset  . Diabetes Father   . Hypertension Father   . Breast cancer Mother 20  . Diabetes Paternal Grandmother   . Cancer Paternal Grandmother        GI cancer, lung cancer  . Breast cancer Maternal Grandmother 7  . Heart attack Paternal Grandfather     SOCIAL HISTORY Social History   Tobacco Use  . Smoking status: Never Smoker  . Smokeless tobacco: Never Used  Vaping Use  .  Vaping Use: Never used  Substance Use Topics  . Alcohol use: Not Currently    Alcohol/week: 7.0 standard drinks    Types: 7 Standard drinks or equivalent per week    Comment: NONE WHILE PREG  . Drug use: No         OPHTHALMIC EXAM:  Base Eye Exam    Visual Acuity (Snellen - Linear)      Right Left   Dist [place] 20/20 20/20 -       Tonometry (Tonopen, 9:06 AM)      Right Left   Pressure 19 17       Pupils      Dark Light Shape React APD   Right 3 2 Round Brisk None   Left 3 2 Round Brisk None       Visual Fields (Counting fingers)      Left Right    Full Full       Extraocular Movement      Right Left    Full, Ortho Full, Ortho       Neuro/Psych     Oriented x3: Yes   Mood/Affect: Normal       Dilation    Both eyes: 1.0% Mydriacyl, 2.5% Phenylephrine @ 9:06 AM        Slit Lamp and Fundus Exam    Slit Lamp Exam      Right Left   Lids/Lashes Normal Normal   Conjunctiva/Sclera White and quiet White and quiet   Cornea Clear Clear   Anterior Chamber Deep and quiet Deep and quiet   Iris Round and dilated Round and dilated   Lens Clear Clear   Vitreous Vitreous syneresis Vitreous syneresis       Fundus Exam      Right Left   Disc Pink and Sharp Pink and Sharp, Compact   C/D Ratio 0.4 0.3   Macula Flat, Good foveal reflex, RPE mottling, No heme or edema Flat, Good foveal reflex, RPE mottling, No heme or edema   Vessels Normal Normal   Periphery Attached, VR tuft at 1030 equator -- stable; no RT/RD Attached, superior lattice from 1230-0130 with multiple atrophic holes, round hole at 1230 with trace cuff of SRF, focal patch of lattice at 0300--good laser surrounding lattice; pigmented paving stone degeneration inferiorly: ?early lattice changes inferiorly, No new RT/RD           IMAGING AND PROCEDURES  Imaging and Procedures for 12/15/2020  OCT, Retina - OU - Both Eyes       Right Eye Quality was good. Central Foveal Thickness: 279. Progression has been stable. Findings include normal foveal contour, no IRF, no SRF, vitreomacular adhesion , retinal drusen  (Rare drusen).   Left Eye Quality was good. Central Foveal Thickness: 290. Progression has been stable. Findings include normal foveal contour, no IRF, no SRF, vitreomacular adhesion .   Notes *Images captured and stored on drive  Diagnosis / Impression:  NFP, no IRF/SRF OU OD with rare retinal drusen  Clinical management:  See below  Abbreviations: NFP - Normal foveal profile. CME - cystoid macular edema. PED - pigment epithelial detachment. IRF - intraretinal fluid. SRF - subretinal fluid. EZ - ellipsoid zone. ERM - epiretinal membrane. ORA - outer retinal  atrophy. ORT - outer retinal tubulation. SRHM - subretinal hyper-reflective material. IRHM - intraretinal hyper-reflective material               ASSESSMENT/PLAN:    ICD-10-CM   1. Lattice degeneration of  left retina  H35.412   2. Retinal holes, left  H33.322   3. Retinal edema  H35.81 OCT, Retina - OU - Both Eyes    1,2. Lattice degeneration w/ atrophic holes, left eye - superior lattice from 1230-0130 with multiple atrophic holes, round hole at 1230 with trace cuff of SRF; small focal patch of lattice at 0300 - s/p laser retinopexy OS (03.04.22) -- good laser changes in place - completed PF QID OS x7 d - f/u in 4 months, sooner prn -- DFE/OCT  ** VR tuft at 1030 OD -- no RT or SRF **  - monitor  3. No retinal edema on exam or OCT   Ophthalmic Meds Ordered this visit:  No orders of the defined types were placed in this encounter.      Return in about 4 months (around 04/16/2021) for f/u lattice degeneration OS, DFE, OCT.  There are no Patient Instructions on file for this visit.   Explained the diagnoses, plan, and follow up with the patient and they expressed understanding.  Patient expressed understanding of the importance of proper follow up care.   This document serves as a record of services personally performed by Karie Chimera, MD, PhD. It was created on their behalf by Annalee Genta, COMT. The creation of this record is the provider's dictation and/or activities during the visit.  Electronically signed by: Annalee Genta, COMT 12/16/20 12:32 AM  This document serves as a record of services personally performed by Karie Chimera, MD, PhD. It was created on their behalf by Glee Arvin. Manson Passey, OA an ophthalmic technician. The creation of this record is the provider's dictation and/or activities during the visit.    Electronically signed by: Glee Arvin. Manson Passey, [place] 03.30.2022 12:32 AM  Karie Chimera, M.D., Ph.D. Diseases & Surgery of the Retina and Vitreous [facility]  I have reviewed the above documentation for accuracy and completeness, and I agree with the above. Karie Chimera, M.D., Ph.D. 12/16/20 12:32 AM   Abbreviations: M myopia (nearsighted); A astigmatism; H hyperopia (farsighted); P presbyopia; Mrx spectacle prescription;  CTL contact lenses; OD right eye; OS left eye; OU both eyes  XT exotropia; ET esotropia; PEK punctate epithelial keratitis; PEE punctate epithelial erosions; DES dry eye syndrome; MGD meibomian gland dysfunction; ATs artificial tears; PFAT's preservative free artificial tears; NSC nuclear sclerotic cataract; PSC posterior subcapsular cataract; ERM epi-retinal membrane; PVD posterior vitreous detachment; RD retinal detachment; DM diabetes mellitus; DR diabetic retinopathy; NPDR non-proliferative diabetic retinopathy; PDR proliferative diabetic retinopathy; CSME clinically significant macular edema; DME diabetic macular edema; dbh dot blot hemorrhages; CWS cotton wool spot; POAG primary open angle glaucoma; C/D cup-to-disc ratio; HVF humphrey visual field; GVF goldmann visual field; OCT optical coherence tomography; IOP intraocular pressure; BRVO Branch retinal vein occlusion; CRVO central retinal vein occlusion; CRAO central retinal artery occlusion; BRAO branch retinal artery occlusion; RT retinal tear; SB scleral buckle; PPV pars plana vitrectomy; VH Vitreous hemorrhage; PRP panretinal laser photocoagulation; IVK intravitreal kenalog; VMT vitreomacular traction; MH Macular hole;  NVD neovascularization of the disc; NVE neovascularization elsewhere; AREDS age related eye disease study; ARMD age related macular degeneration; POAG primary open angle glaucoma; EBMD epithelial/anterior basement membrane dystrophy; ACIOL anterior chamber intraocular lens; IOL intraocular lens; PCIOL posterior chamber intraocular lens; Phaco/IOL phacoemulsification with intraocular lens placement; PRK photorefractive keratectomy; LASIK  laser assisted in situ keratomileusis; HTN hypertension; DM diabetes mellitus; COPD chronic obstructive pulmonary disease

## 2020-12-15 ENCOUNTER — Ambulatory Visit (INDEPENDENT_AMBULATORY_CARE_PROVIDER_SITE_OTHER): Payer: BC Managed Care – PPO | Admitting: Ophthalmology

## 2020-12-15 ENCOUNTER — Other Ambulatory Visit: Payer: Self-pay

## 2020-12-15 ENCOUNTER — Encounter (INDEPENDENT_AMBULATORY_CARE_PROVIDER_SITE_OTHER): Payer: Self-pay | Admitting: Ophthalmology

## 2020-12-15 DIAGNOSIS — H3581 Retinal edema: Secondary | ICD-10-CM

## 2020-12-15 DIAGNOSIS — H35412 Lattice degeneration of retina, left eye: Secondary | ICD-10-CM

## 2020-12-15 DIAGNOSIS — H33322 Round hole, left eye: Secondary | ICD-10-CM

## 2021-01-11 ENCOUNTER — Other Ambulatory Visit (HOSPITAL_COMMUNITY): Payer: Self-pay

## 2021-01-11 MED ORDER — MOLNUPIRAVIR 200 MG PO CAPS
ORAL_CAPSULE | ORAL | 0 refills | Status: AC
  Filled 2021-01-11: qty 40, 5d supply, fill #0

## 2021-02-10 DIAGNOSIS — N926 Irregular menstruation, unspecified: Secondary | ICD-10-CM | POA: Diagnosis not present

## 2021-03-14 DIAGNOSIS — N926 Irregular menstruation, unspecified: Secondary | ICD-10-CM | POA: Diagnosis not present

## 2021-04-18 DIAGNOSIS — Z01419 Encounter for gynecological examination (general) (routine) without abnormal findings: Secondary | ICD-10-CM | POA: Diagnosis not present

## 2021-04-18 DIAGNOSIS — Z113 Encounter for screening for infections with a predominantly sexual mode of transmission: Secondary | ICD-10-CM | POA: Diagnosis not present

## 2021-04-18 DIAGNOSIS — Z124 Encounter for screening for malignant neoplasm of cervix: Secondary | ICD-10-CM | POA: Diagnosis not present

## 2021-04-18 DIAGNOSIS — Z6821 Body mass index (BMI) 21.0-21.9, adult: Secondary | ICD-10-CM | POA: Diagnosis not present

## 2021-04-18 DIAGNOSIS — Z01411 Encounter for gynecological examination (general) (routine) with abnormal findings: Secondary | ICD-10-CM | POA: Diagnosis not present

## 2021-04-18 DIAGNOSIS — Z1231 Encounter for screening mammogram for malignant neoplasm of breast: Secondary | ICD-10-CM | POA: Diagnosis not present

## 2021-04-20 ENCOUNTER — Encounter (INDEPENDENT_AMBULATORY_CARE_PROVIDER_SITE_OTHER): Payer: BC Managed Care – PPO | Admitting: Ophthalmology

## 2021-04-20 DIAGNOSIS — H35412 Lattice degeneration of retina, left eye: Secondary | ICD-10-CM

## 2021-04-20 DIAGNOSIS — H3581 Retinal edema: Secondary | ICD-10-CM

## 2021-04-20 DIAGNOSIS — H33322 Round hole, left eye: Secondary | ICD-10-CM

## 2021-05-16 ENCOUNTER — Encounter (INDEPENDENT_AMBULATORY_CARE_PROVIDER_SITE_OTHER): Payer: Self-pay

## 2021-05-16 ENCOUNTER — Encounter (INDEPENDENT_AMBULATORY_CARE_PROVIDER_SITE_OTHER): Payer: BC Managed Care – PPO | Admitting: Ophthalmology

## 2021-05-16 DIAGNOSIS — H35412 Lattice degeneration of retina, left eye: Secondary | ICD-10-CM

## 2021-05-16 DIAGNOSIS — H33322 Round hole, left eye: Secondary | ICD-10-CM

## 2021-05-16 DIAGNOSIS — H3581 Retinal edema: Secondary | ICD-10-CM

## 2021-06-09 NOTE — Progress Notes (Signed)
[facility] Note  06/13/2021     CHIEF COMPLAINT Patient presents for Retina Follow Up   HISTORY OF PRESENT ILLNESS: Kelsey Cox is a 40 y.o. female who presents to the clinic today for:   HPI     Retina Follow Up   Patient presents with  Other.  In left eye.  Severity is mild.  Duration of 6 months.  Since onset it is stable.  I, the attending physician,  performed the HPI with the patient and updated documentation appropriately.        Comments   Pt here for 6 mo ret f/u lattice degen OS. Pt states no vision changes or issues. No ocular pain or discomfort.       Last edited by Bernarda Caffey, MD on 06/14/2021 12:48 PM.    pt states no new floaters, vision is stable  Referring physician: Monna Fam, MD [place],  [place] [address]  HISTORICAL INFORMATION:   Selected notes from the MEDICAL RECORD NUMBER Referred by Dr. Kathlen Mody for eval of lattice/retinal holes   CURRENT MEDICATIONS: No current outpatient medications on file. (Ophthalmic Drugs)   No current facility-administered medications for this visit. (Ophthalmic Drugs)   Current Outpatient Medications (Other)  Medication Sig   ibuprofen (ADVIL,MOTRIN) 600 MG tablet Take 1 tablet (600 mg total) by mouth every 6 (six) hours. (Patient not taking: Reported on 06/13/2021)   Molnupiravir 200 MG CAPS Take 4 capsules by mouth every 12 hours (Patient not taking: Reported on 06/13/2021)   oxyCODONE (OXY IR/ROXICODONE) 5 MG immediate release tablet Take 1 tablet (5 mg total) by mouth every 4 (four) hours as needed (pain scale 4-7). (Patient not taking: Reported on 06/13/2021)   Prenatal Vit-Fe Fumarate-FA (PRENATAL MULTIVITAMIN) TABS tablet Take 1 tablet by mouth daily at 12 noon. (Patient not taking: Reported on 06/13/2021)   No current facility-administered medications for this visit. (Other)   REVIEW OF SYSTEMS: ROS   Positive for: Genitourinary, Eyes Negative for:  Constitutional, Gastrointestinal, Neurological, Skin, Musculoskeletal, HENT, Endocrine, Cardiovascular, Respiratory, Psychiatric, Allergic/Imm, Heme/Lymph Last edited by Kingsley Spittle, COT on 06/13/2021  8:49 AM.     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   Renal stone    age 56, passed   UTI (urinary tract infection)    Yeast infection    chronic   Past Surgical History:  Procedure Laterality Date   CESAREAN SECTION N/A 06/01/2014   Procedure: CESAREAN SECTION;  Surgeon: Lovenia Kim, MD;  Location: [facility];  Service: Obstetrics;  Laterality: N/A;   HYSTEROSCOPY     with polyp resection   WISDOM TOOTH EXTRACTION      FAMILY HISTORY Family History  Problem Relation Age of Onset   Diabetes Father    Hypertension Father    Breast cancer Mother 34   Diabetes Paternal Grandmother    Cancer Paternal Grandmother        GI cancer, lung cancer   Breast cancer Maternal Grandmother 105   Heart attack Paternal Grandfather     SOCIAL HISTORY Social History   Tobacco Use   Smoking status: Never   Smokeless tobacco: Never  Vaping Use   Vaping Use: Never used  Substance Use Topics   Alcohol use: Not Currently  Alcohol/week: 7.0 standard drinks    Types: 7 Standard drinks or equivalent per week    Comment: NONE WHILE PREG   Drug use: No       OPHTHALMIC EXAM:  Base Eye Exam     Visual Acuity (Snellen - Linear)       Right Left   Dist [place] 20/25 -2 20/25 +2   Dist [place] 20/20 -1 20/20         Tonometry (Tonopen, 9:01 AM)       Right Left   Pressure 18 19         Pupils       Dark Light Shape React APD   Right 3 2 Round Brisk None   Left 3 2 Round Brisk None         Visual Fields (Counting fingers)       Left Right    Full Full         Extraocular Movement       Right  Left    Full, Ortho Full, Ortho         Neuro/Psych     Oriented x3: Yes   Mood/Affect: Normal         Dilation     Both eyes: 1.0% Mydriacyl, 2.5% Phenylephrine @ 9:01 AM           Slit Lamp and Fundus Exam     Slit Lamp Exam       Right Left   Lids/Lashes Normal Normal   Conjunctiva/Sclera White and quiet White and quiet   Cornea Clear Clear   Anterior Chamber Deep and quiet Deep and quiet   Iris Round and dilated Round and dilated   Lens Clear Clear   Vitreous Vitreous syneresis Vitreous syneresis         Fundus Exam       Right Left   Disc Pink and Sharp Pink and Sharp, Compact   C/D Ratio 0.4 0.3   Macula Flat, Good foveal reflex, RPE mottling, No heme or edema Flat, Good foveal reflex, RPE mottling, No heme or edema   Vessels Normal Normal   Periphery Attached, VR tuft at 1030 equator -- stable; no RT/RD, ?early lattice changes inferiorly Attached, superior lattice from 1230-0130 with multiple atrophic holes, round hole at 1230 with trace cuff of SRF, focal patch of lattice at 0300--good laser surrounding lattice; pigmented paving stone degeneration inferiorly: ?early lattice changes inferiorly, No new RT/RD             IMAGING AND PROCEDURES  Imaging and Procedures for 06/13/2021  OCT, Retina - OU - Both Eyes       Right Eye Quality was good. Central Foveal Thickness: 278. Progression has been stable. Findings include normal foveal contour, no IRF, no SRF, vitreomacular adhesion , retinal drusen (Rare drusen).   Left Eye Quality was good. Central Foveal Thickness: 287. Progression has been stable. Findings include normal foveal contour, no IRF, no SRF, vitreomacular adhesion .   Notes *Images captured and stored on drive  Diagnosis / Impression:  NFP, no IRF/SRF OU OD with rare retinal drusen  Clinical management:  See below  Abbreviations: NFP - Normal foveal profile. CME - cystoid macular edema. PED - pigment epithelial detachment.  IRF - intraretinal fluid. SRF - subretinal fluid. EZ - ellipsoid zone. ERM - epiretinal membrane. ORA - outer retinal atrophy. ORT - outer retinal tubulation. SRHM - subretinal hyper-reflective material. 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 Retinal holes, left  H33.322     3. Retinal defect, right  H33.301     4. Retinal edema  H35.81 OCT, Retina - OU - Both Eyes      1,2. Lattice degeneration w/ atrophic holes, left eye - superior lattice from 1230-0130 with multiple atrophic holes, round hole at 1230 with trace cuff of SRF; small focal patch of lattice at 0300 - s/p laser retinopexy OS (03.04.22) -- good laser changes in place - f/u in 4 months, sooner prn -- DFE/OCT  3. 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 No retinal edema on exam or OCT  Ophthalmic Meds Ordered this visit:  No orders of the defined types were placed in this encounter.      Return for within 6 weeks for laser retinopexy OD, DFE, OCT.  There are no Patient Instructions on file for this visit.  This document serves as a record of services personally performed by Gardiner Sleeper, MD, PhD. It was created on their behalf by Leeann Must, [place], an ophthalmic technician. The creation of this record is the provider's dictation and/or activities during the visit.    Electronically signed by: Leeann Must, COA $[email or web address]$ @ 12:50 PM  This document serves as a record of services personally performed by Gardiner Sleeper, MD, PhD. It was created on their behalf by San Jetty. Owens Shark, OA an ophthalmic technician. The creation of this record is the provider's dictation and/or activities during the visit.    Electronically signed by: San Jetty. Marguerita Merles 09.26.2022 12:50 PM  Gardiner Sleeper, M.D., Ph.D. Diseases & Surgery of the Retina and [place] 06/13/2021   I have reviewed the above documentation for accuracy and completeness, and I agree with the above. Gardiner Sleeper, M.D., Ph.D. 06/14/21 12:50 PM   Abbreviations: M myopia (nearsighted); A astigmatism; H hyperopia (farsighted); P presbyopia; Mrx spectacle prescription;  CTL contact lenses; OD right eye; OS left eye; OU both eyes  XT exotropia; ET esotropia; PEK punctate epithelial keratitis; PEE punctate epithelial erosions; DES dry eye syndrome; MGD meibomian gland dysfunction; ATs artificial tears; PFAT's preservative free artificial tears; [place] nuclear sclerotic cataract; PSC posterior subcapsular cataract; ERM epi-retinal membrane; PVD posterior vitreous detachment; RD retinal detachment; DM diabetes mellitus; DR diabetic retinopathy; NPDR non-proliferative diabetic retinopathy; PDR proliferative diabetic retinopathy; CSME clinically significant macular edema; DME diabetic macular edema; dbh dot blot hemorrhages; CWS cotton wool spot; POAG primary open angle glaucoma; C/D cup-to-disc ratio; HVF humphrey visual field; GVF goldmann visual field; OCT optical coherence tomography; IOP intraocular pressure; BRVO Branch retinal vein occlusion; CRVO central retinal vein occlusion; CRAO central retinal artery occlusion; BRAO branch retinal artery occlusion; RT retinal tear; SB scleral buckle; PPV pars plana vitrectomy; VH Vitreous hemorrhage; PRP panretinal laser photocoagulation; IVK intravitreal kenalog; VMT vitreomacular traction; MH Macular hole;  NVD neovascularization of the disc; NVE neovascularization elsewhere; AREDS age related eye disease study; ARMD age related macular degeneration; POAG primary open angle glaucoma; EBMD epithelial/anterior basement membrane dystrophy; ACIOL anterior chamber intraocular lens; IOL intraocular lens; PCIOL posterior chamber intraocular lens; Phaco/IOL  phacoemulsification with intraocular lens placement; PRK photorefractive keratectomy; LASIK laser assisted in situ keratomileusis; HTN hypertension;  DM diabetes mellitus; COPD chronic obstructive pulmonary disease

## 2021-06-13 ENCOUNTER — Ambulatory Visit (INDEPENDENT_AMBULATORY_CARE_PROVIDER_SITE_OTHER): Payer: BC Managed Care – PPO | Admitting: Ophthalmology

## 2021-06-13 ENCOUNTER — Encounter (INDEPENDENT_AMBULATORY_CARE_PROVIDER_SITE_OTHER): Payer: Self-pay | Admitting: Ophthalmology

## 2021-06-13 ENCOUNTER — Other Ambulatory Visit: Payer: Self-pay

## 2021-06-13 DIAGNOSIS — H33322 Round hole, left eye: Secondary | ICD-10-CM | POA: Diagnosis not present

## 2021-06-13 DIAGNOSIS — H35412 Lattice degeneration of retina, left eye: Secondary | ICD-10-CM

## 2021-06-13 DIAGNOSIS — H33301 Unspecified retinal break, right eye: Secondary | ICD-10-CM

## 2021-06-13 DIAGNOSIS — H3581 Retinal edema: Secondary | ICD-10-CM

## 2021-06-14 ENCOUNTER — Encounter (INDEPENDENT_AMBULATORY_CARE_PROVIDER_SITE_OTHER): Payer: Self-pay | Admitting: Ophthalmology

## 2021-07-08 DIAGNOSIS — H5789 Other specified disorders of eye and adnexa: Secondary | ICD-10-CM | POA: Diagnosis not present

## 2021-07-08 DIAGNOSIS — R051 Acute cough: Secondary | ICD-10-CM | POA: Diagnosis not present

## 2021-07-21 NOTE — Progress Notes (Signed)
[facility] - Clinic Note  07/25/2021     CHIEF COMPLAINT Patient presents for Retina Follow Up   HISTORY OF PRESENT ILLNESS: Kelsey Cox is a 40 y.o. female who presents to the clinic today for:   HPI     Retina Follow Up   Patient presents with  Other.  In right eye.  This started months ago.  Severity is mild.  Duration of 6 weeks.  Since onset it is stable.  I, the attending physician,  performed the HPI with the patient and updated documentation appropriately.        Comments   40 y/o female pt here for 6 wk f/u for VR tuft OD.  Here for laser OD today.  No change in [place] OU.  Denies pain, FOL, floaters.  No gtts.      Last edited by Rennis Chris, MD on 07/25/2021 12:23 PM.     pt states no new floaters, vision is stable  Referring physician: No referring provider defined for this encounter.  HISTORICAL INFORMATION:   Selected notes from the MEDICAL RECORD NUMBER Referred by Dr. Alben Spittle for eval of lattice/retinal holes   CURRENT MEDICATIONS: No current outpatient medications on file. (Ophthalmic Drugs)   No current facility-administered medications for this visit. (Ophthalmic Drugs)   Current Outpatient Medications (Other)  Medication Sig   ibuprofen (ADVIL,MOTRIN) 600 MG tablet Take 1 tablet (600 mg total) by mouth every 6 (six) hours.   Molnupiravir 200 MG CAPS Take 4 capsules by mouth every 12 hours   oxyCODONE (OXY IR/ROXICODONE) 5 MG immediate release tablet Take 1 tablet (5 mg total) by mouth every 4 (four) hours as needed (pain scale 4-7).   Prenatal Vit-Fe Fumarate-FA (PRENATAL MULTIVITAMIN) TABS tablet Take 1 tablet by mouth daily at 12 noon.   No current facility-administered medications for this visit. (Other)   REVIEW OF SYSTEMS: ROS   Positive for: Eyes Negative for: Constitutional, Gastrointestinal, Neurological, Skin, Genitourinary, Musculoskeletal, HENT, Endocrine, Cardiovascular, Respiratory, Psychiatric,  Allergic/Imm, Heme/Lymph Last edited by Celine Mans, COA on 07/25/2021 10:22 AM.      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   Renal stone    age 49, passed   UTI (urinary tract infection)    Yeast infection    chronic   Past Surgical History:  Procedure Laterality Date   CESAREAN SECTION N/A 06/01/2014   Procedure: CESAREAN SECTION;  Surgeon: Lenoard Aden, MD;  Location: [facility];  Service: Obstetrics;  Laterality: N/A;   HYSTEROSCOPY     with polyp resection   WISDOM TOOTH EXTRACTION      FAMILY HISTORY Family History  Problem Relation Age of Onset   Diabetes Father    Hypertension Father    Breast cancer Mother 55   Diabetes Paternal Grandmother    Cancer Paternal Grandmother        GI cancer, lung cancer   Breast cancer Maternal Grandmother 83   Heart attack Paternal Grandfather     SOCIAL HISTORY Social History   Tobacco Use   Smoking status: Never   Smokeless tobacco: Never  Vaping Use   Vaping Use: Never used  Substance Use Topics   Alcohol use: Not Currently    Alcohol/week: 7.0 standard drinks    Types: 7  Standard drinks or equivalent per week    Comment: NONE WHILE PREG   Drug use: No       OPHTHALMIC EXAM:  Base Eye Exam     Visual Acuity (Snellen - Linear)       Right Left   Dist [place] 20/20 - 20/20 -         Tonometry (Tonopen, 10:26 AM)       Right Left   Pressure 14 15         Pupils       Dark Light Shape React APD   Right 3 2 Round Brisk None   Left 3 2 Round Brisk None         Visual Fields (Counting fingers)       Left Right    Full Full         Extraocular Movement       Right Left    Full, Ortho Full, Ortho         Neuro/Psych     Oriented x3: Yes   Mood/Affect: Normal         Dilation     Right eye: 1.0% Mydriacyl,  2.5% Phenylephrine @ 10:26 AM           Slit Lamp and Fundus Exam     Slit Lamp Exam       Right Left   Lids/Lashes Normal Normal   Conjunctiva/Sclera White and quiet White and quiet   Cornea Clear Clear   Anterior Chamber Deep and quiet Deep and quiet   Iris Round and dilated Round and dilated   Lens Clear Clear   Vitreous Vitreous syneresis Vitreous syneresis         Fundus Exam       Right Left   Disc Pink and Sharp Pink and Sharp, Compact   C/D Ratio 0.4 0.3   Macula Flat, Good foveal reflex, RPE mottling, No heme or edema Flat, Good foveal reflex, RPE mottling, No heme or edema   Vessels Normal Normal   Periphery Attached, VR tuft at 1030 equator -- stable; focal VR tufts at 1200 and 0100 ora; no RT/RD, ?early lattice changes inferiorly Attached, superior lattice from 1230-0130 with multiple atrophic holes, round hole at 1230 with trace cuff of SRF, focal patch of lattice at 0300--good laser surrounding lattice; pigmented paving stone degeneration inferiorly: ?early lattice changes inferiorly, No new RT/RD             IMAGING AND PROCEDURES  Imaging and Procedures for 07/25/2021  OCT, Retina - OU - Both Eyes       Right Eye Quality was good. Central Foveal Thickness: 277. Progression has been stable. Findings include normal foveal contour, no IRF, no SRF, vitreomacular adhesion (Rare drusen).   Left Eye Quality was good. Central Foveal Thickness: 285. Progression has been stable. Findings include normal foveal contour, no IRF, no SRF, vitreomacular adhesion .   Notes *Images captured and stored on drive  Diagnosis / Impression:  NFP, no IRF/SRF OU OD with rare retinal drusen  Clinical management:  See below  Abbreviations: NFP - Normal foveal profile. CME - cystoid macular edema. PED - pigment epithelial detachment. IRF - intraretinal fluid. SRF - subretinal fluid. EZ - ellipsoid zone. ERM - epiretinal membrane. ORA - outer retinal atrophy. ORT - outer  retinal tubulation. SRHM - subretinal hyper-reflective material. IRHM - intraretinal hyper-reflective material      Repair Retinal Breaks, Laser - OD - Right Eye  LASER PROCEDURE NOTE  Procedure:  Barrier laser retinopexy using slit lamp laser, RIGHT eye   Diagnosis:   Retinal defects / VR tufts, RIGHT eye                     Focal VR tufts at 1030, 1200 and 0100, anterior to equator   Surgeon: Rennis Chris, MD, PhD  Anesthesia: Topical  Informed consent obtained, operative eye marked, and time out performed prior to initiation of laser.   Laser settings:  Lumenis [id number] laser, slit lamp Lens: [place] PRP 165 Power: 270 mW Spot size: 200 microns Duration: 30 msec  # spots: 370  Placement of laser: Using a [place] PRP 165 contact lens at the slit lamp, laser was placed in three confluent rows around VR tuft at 1030 oclock anterior to equator and posterior to VR tufts at 1200 and 0100 ora.  Complications: None.  Patient tolerated the procedure well and received written and verbal post-procedure care information/education.             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 Retinal holes, left  H33.322     3. Retinal defect, right  H33.301 Repair Retinal Breaks, Laser - OD - Right Eye    4. Retinal edema  H35.81 OCT, Retina - OU - Both Eyes      1,2. Lattice degeneration w/ atrophic holes, left eye - superior lattice from 1230-0130 with multiple atrophic holes, round hole at 1230 with trace cuff of SRF; small focal patch of lattice at 0300 - s/p laser retinopexy OS (03.04.22) -- good laser changes in place - f/u in 4 months, sooner prn -- DFE/OCT  3. VR tufts at 1030, 1200 and 0100 OD -- no RT or SRF  - discussed findings, prognosis and possible treatment  - recommend laser retinopexy OD today, 11.07.22  - pt wishes to proceed with laser  - RBA of procedure discussed, questions answered - informed  consent obtained and signed - see procedure note - start Lotemax SM QID OD x5 days -- sample bottle given  - f/u 4 weeks, DFE, OCT  4. No retinal edema on exam or OCT  Ophthalmic Meds Ordered this visit:  No orders of the defined types were placed in this encounter.    Return in about 4 weeks (around 08/22/2021) for f/u lattice degeneration OS, DFE, OCT.  There are no Patient Instructions on file for this visit.  This document serves as a record of services personally performed by Karie Chimera, MD, PhD. It was created on their behalf by Herby Abraham, COA, an ophthalmic technician. The creation of this record is the provider's dictation and/or activities during the visit.    Electronically signed by: Herby Abraham, COA @TODAY @ 12:25 PM  , M.D., Ph.D. Diseases & Surgery of the Retina and Vitreous [facility] 07/25/2021   I have reviewed the above documentation for accuracy and completeness, and I agree with the above. 13/03/2021, M.D., Ph.D. 07/25/21 12:25 PM   Abbreviations: M myopia (nearsighted); A astigmatism; H hyperopia (farsighted); P presbyopia; Mrx spectacle prescription;  CTL contact lenses; OD right eye; OS left eye; OU both eyes  XT exotropia; ET esotropia; PEK punctate epithelial keratitis; PEE punctate epithelial erosions; DES dry eye syndrome; MGD meibomian gland dysfunction; ATs artificial tears; PFAT's preservative free artificial tears; NSC nuclear sclerotic cataract; PSC posterior subcapsular  cataract; ERM epi-retinal membrane; PVD posterior vitreous detachment; RD retinal detachment; DM diabetes mellitus; DR diabetic retinopathy; NPDR non-proliferative diabetic retinopathy; PDR proliferative diabetic retinopathy; CSME clinically significant macular edema; DME diabetic macular edema; dbh dot blot hemorrhages; CWS cotton wool spot; POAG primary open angle glaucoma; C/D cup-to-disc ratio; HVF humphrey visual field; GVF  goldmann visual field; OCT optical coherence tomography; IOP intraocular pressure; BRVO Branch retinal vein occlusion; CRVO central retinal vein occlusion; CRAO central retinal artery occlusion; BRAO branch retinal artery occlusion; RT retinal tear; SB scleral buckle; PPV pars plana vitrectomy; VH Vitreous hemorrhage; PRP panretinal laser photocoagulation; IVK intravitreal kenalog; VMT vitreomacular traction; MH Macular hole;  NVD neovascularization of the disc; NVE neovascularization elsewhere; AREDS age related eye disease study; ARMD age related macular degeneration; POAG primary open angle glaucoma; EBMD epithelial/anterior basement membrane dystrophy; ACIOL anterior chamber intraocular lens; IOL intraocular lens; PCIOL posterior chamber intraocular lens; Phaco/IOL phacoemulsification with intraocular lens placement; PRK photorefractive keratectomy; LASIK laser assisted in situ keratomileusis; HTN hypertension; DM diabetes mellitus; COPD chronic obstructive pulmonary disease

## 2021-07-25 ENCOUNTER — Encounter (INDEPENDENT_AMBULATORY_CARE_PROVIDER_SITE_OTHER): Payer: Self-pay | Admitting: Ophthalmology

## 2021-07-25 ENCOUNTER — Other Ambulatory Visit: Payer: Self-pay

## 2021-07-25 ENCOUNTER — Ambulatory Visit (INDEPENDENT_AMBULATORY_CARE_PROVIDER_SITE_OTHER): Payer: BC Managed Care – PPO | Admitting: Ophthalmology

## 2021-07-25 DIAGNOSIS — H33301 Unspecified retinal break, right eye: Secondary | ICD-10-CM

## 2021-07-25 DIAGNOSIS — H33322 Round hole, left eye: Secondary | ICD-10-CM | POA: Diagnosis not present

## 2021-07-25 DIAGNOSIS — H35412 Lattice degeneration of retina, left eye: Secondary | ICD-10-CM

## 2021-07-25 DIAGNOSIS — H3581 Retinal edema: Secondary | ICD-10-CM

## 2021-08-15 DIAGNOSIS — H6501 Acute serous otitis media, right ear: Secondary | ICD-10-CM | POA: Diagnosis not present

## 2021-08-19 ENCOUNTER — Telehealth (INDEPENDENT_AMBULATORY_CARE_PROVIDER_SITE_OTHER): Payer: Self-pay

## 2021-08-19 NOTE — Telephone Encounter (Signed)
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 I called pt and let her know to be cautious she should r/s her appt sfor the following day, 12/13 since that would fit her schedule better. Told pt she could c/b and r/s w/ the front desk or if she had further questions to ask for me. MS

## 2021-08-22 ENCOUNTER — Encounter (INDEPENDENT_AMBULATORY_CARE_PROVIDER_SITE_OTHER): Payer: BC Managed Care – PPO | Admitting: Ophthalmology

## 2021-08-25 NOTE — Progress Notes (Signed)
[facility] - Clinic Note  08/31/2021     CHIEF COMPLAINT Patient presents for Retina Follow Up   HISTORY OF PRESENT ILLNESS: Kelsey Cox is a 40 y.o. female who presents to the clinic today for:   HPI     Retina Follow Up   Patient presents with  Other.  In right eye.  Duration of 5 weeks.  Since onset it is stable.  I, the attending physician,  performed the HPI with the patient and updated documentation appropriately.        Comments   5 week follow up VR tufts OD-  Doing well, vision appears stable OU.  Denies using eye drops.  She has allergies and eye do itch.        Last edited by Rennis Chris, MD on 09/02/2021 12:15 AM.    Pt states no changes in vision, no new fol or floaters   Referring physician: No referring provider defined for this encounter.  HISTORICAL INFORMATION:   Selected notes from the MEDICAL RECORD NUMBER Referred by Dr. Alben Spittle for eval of lattice/retinal holes   CURRENT MEDICATIONS: No current outpatient medications on file. (Ophthalmic Drugs)   No current facility-administered medications for this visit. (Ophthalmic Drugs)   Current Outpatient Medications (Other)  Medication Sig   ibuprofen (ADVIL,MOTRIN) 600 MG tablet Take 1 tablet (600 mg total) by mouth every 6 (six) hours. (Patient not taking: Reported on 08/31/2021)   Molnupiravir 200 MG CAPS Take 4 capsules by mouth every 12 hours (Patient not taking: Reported on 08/31/2021)   oxyCODONE (OXY IR/ROXICODONE) 5 MG immediate release tablet Take 1 tablet (5 mg total) by mouth every 4 (four) hours as needed (pain scale 4-7). (Patient not taking: Reported on 08/31/2021)   Prenatal Vit-Fe Fumarate-FA (PRENATAL MULTIVITAMIN) TABS tablet Take 1 tablet by mouth daily at 12 noon. (Patient not taking: Reported on 08/31/2021)   No current facility-administered medications for this visit. (Other)   REVIEW OF SYSTEMS: ROS   Positive for: Eyes Negative for:  Constitutional, Gastrointestinal, Neurological, Skin, Genitourinary, Musculoskeletal, HENT, Endocrine, Cardiovascular, Respiratory, Psychiatric, Allergic/Imm, Heme/Lymph Last edited by Joni Reining, COA on 08/31/2021  8:42 AM.       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   Renal stone    age 59, passed   UTI (urinary tract infection)    Yeast infection    chronic   Past Surgical History:  Procedure Laterality Date   CESAREAN SECTION N/A 06/01/2014   Procedure: CESAREAN SECTION;  Surgeon: Lenoard Aden, MD;  Location: [facility];  Service: Obstetrics;  Laterality: N/A;   HYSTEROSCOPY     with polyp resection   WISDOM TOOTH EXTRACTION      FAMILY HISTORY Family History  Problem Relation Age of Onset   Diabetes Father    Hypertension Father    Breast cancer Mother 61   Diabetes Paternal Grandmother    Cancer Paternal Grandmother        GI cancer, lung cancer   Breast cancer Maternal Grandmother 52   Heart attack Paternal Grandfather     SOCIAL HISTORY Social History   Tobacco Use   Smoking status: Never   Smokeless tobacco: Never  Vaping Use   Vaping Use: Never used  Substance Use Topics   Alcohol  use: Not Currently    Alcohol/week: 7.0 standard drinks    Types: 7 Standard drinks or equivalent per week    Comment: NONE WHILE PREG   Drug use: No       OPHTHALMIC EXAM:  Base Eye Exam     Visual Acuity (Snellen - Linear)       Right Left   Dist [place] 20/20 20/20 -2    Correction: Glasses         Tonometry (Tonopen, 8:46 AM)       Right Left   Pressure 17 19         Pupils       Dark Light Shape React APD   Right 3 2 Round Brisk None   Left 3 2 Round Brisk None         Visual Fields (Counting fingers)       Left Right    Full Full         Extraocular Movement        Right Left    Full Full         Neuro/Psych     Oriented x3: Yes   Mood/Affect: Normal         Dilation     Right eye: 1.0% Mydriacyl, 2.5% Phenylephrine @ 8:46 AM  OD only per pts request          Slit Lamp and Fundus Exam     Slit Lamp Exam       Right Left   Lids/Lashes Normal Normal   Conjunctiva/Sclera White and quiet White and quiet   Cornea Clear Clear   Anterior Chamber Deep and quiet Deep and quiet   Iris Round and dilated Round and dilated   Lens Clear Clear   Anterior Vitreous Vitreous syneresis Vitreous syneresis         Fundus Exam       Right Left   Disc Pink and Sharp Pink and Sharp, Compact   C/D Ratio 0.3 0.3   Macula Flat, Good foveal reflex, RPE mottling, No heme or edema Flat, Good foveal reflex, RPE mottling, No heme or edema   Vessels Normal Normal   Periphery Attached, VR tuft at 1030 equator -- stable; focal VR tufts at 1200 and 0100 ora -- good laser surrounding all lesions; no RT/RD Attached, superior lattice from 1230-0130 with multiple atrophic holes, round hole at 1230 with trace cuff of SRF, focal patch of lattice at 0300--good laser surrounding lattice; pigmented paving stone degeneration inferiorly; No new RT/RD            IMAGING AND PROCEDURES  Imaging and Procedures for 08/31/2021  OCT, Retina - OU - Both Eyes       Right Eye Quality was good. Central Foveal Thickness: 280. Progression has been stable. Findings include normal foveal contour, no IRF, no SRF, vitreomacular adhesion (Rare drusen).   Left Eye Quality was good. Central Foveal Thickness: 289. Progression has been stable. Findings include normal foveal contour, no IRF, no SRF, vitreomacular adhesion .   Notes *Images captured and stored on drive  Diagnosis / Impression:  NFP, no IRF/SRF OU OD with rare retinal drusen  Clinical management:  See below  Abbreviations: NFP - Normal foveal profile. CME - cystoid macular edema. PED - pigment epithelial  detachment. IRF - intraretinal fluid. SRF - subretinal fluid. EZ - ellipsoid zone. ERM - epiretinal membrane. ORA - outer retinal atrophy. ORT - outer retinal tubulation. SRHM - subretinal hyper-reflective  material. IRHM - intraretinal hyper-reflective material             ASSESSMENT/PLAN:    ICD-10-CM   1. Lattice degeneration of left retina  H35.412 OCT, Retina - OU - Both Eyes    2. Retinal holes, left  H33.322 OCT, Retina - OU - Both Eyes    3. Retinal defect, right  H33.301 OCT, Retina - OU - Both Eyes       1,2. Lattice degeneration w/ atrophic holes, left eye - superior lattice from 1230-0130 with multiple atrophic holes, round hole at 1230 with trace cuff of SRF; small focal patch of lattice at 0300 - s/p laser retinopexy OS (03.04.22) -- good laser changes in place - f/u in 3 months, sooner prn -- DFE/OCT  3. 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    Return in about 3 months (around 11/29/2021) for f/u  lattice degeneration OU, DFE, OCT.  There are no Patient Instructions on file for this visit.  This document serves as a record of services personally performed by Karie Chimera, MD, PhD. It was created on their behalf by De Blanch, an ophthalmic technician. The creation of this record is the provider's dictation and/or activities during the visit.    Electronically signed by: De Blanch, OA, 09/02/21  12:19 AM  This document serves as a record of services personally performed by Karie Chimera, MD, PhD. It was created on their behalf by Glee Arvin. Manson Passey, OA an ophthalmic technician. The creation of this record is the provider's dictation  and/or activities during the visit.    Electronically signed by: Glee Arvin. Manson Passey, [place] 12.14.2022 12:19 AM  Karie Chimera, M.D., Ph.D. Diseases & Surgery of the Retina and Vitreous [facility]  I have reviewed the above documentation for accuracy and completeness, and I agree with the above. Karie Chimera, M.D., Ph.D. 09/02/21 12:19 AM   Abbreviations: M myopia (nearsighted); A astigmatism; H hyperopia (farsighted); P presbyopia; Mrx spectacle prescription;  CTL contact lenses; OD right eye; OS left eye; OU both eyes  XT exotropia; ET esotropia; PEK punctate epithelial keratitis; PEE punctate epithelial erosions; DES dry eye syndrome; MGD meibomian gland dysfunction; ATs artificial tears; PFAT's preservative free artificial tears; NSC nuclear sclerotic cataract; PSC posterior subcapsular cataract; ERM epi-retinal membrane; PVD posterior vitreous detachment; RD retinal detachment; DM diabetes mellitus; DR diabetic retinopathy; NPDR non-proliferative diabetic retinopathy; PDR proliferative diabetic retinopathy; CSME clinically significant macular edema; DME diabetic macular edema; dbh dot blot hemorrhages; CWS cotton wool spot; POAG primary open angle glaucoma; C/D cup-to-disc ratio; HVF humphrey visual field; GVF goldmann visual field; OCT optical coherence tomography; IOP intraocular pressure; BRVO Branch retinal vein occlusion; CRVO central retinal vein occlusion; CRAO central retinal artery occlusion; BRAO branch retinal artery occlusion; RT retinal tear; SB scleral buckle; PPV pars plana vitrectomy; VH Vitreous hemorrhage; PRP panretinal laser photocoagulation; IVK intravitreal kenalog; VMT vitreomacular traction; MH Macular hole;  NVD neovascularization of the disc; NVE neovascularization elsewhere; AREDS age related eye disease study; ARMD age related macular degeneration; POAG primary open angle glaucoma; EBMD epithelial/anterior basement membrane dystrophy;  ACIOL anterior  chamber intraocular lens; IOL intraocular lens; PCIOL posterior chamber intraocular lens; Phaco/IOL phacoemulsification with intraocular lens placement; PRK photorefractive keratectomy; LASIK laser assisted in situ keratomileusis; HTN hypertension; DM diabetes mellitus; COPD chronic obstructive pulmonary disease

## 2021-08-29 ENCOUNTER — Encounter (INDEPENDENT_AMBULATORY_CARE_PROVIDER_SITE_OTHER): Payer: BC Managed Care – PPO | Admitting: Ophthalmology

## 2021-08-30 ENCOUNTER — Encounter (INDEPENDENT_AMBULATORY_CARE_PROVIDER_SITE_OTHER): Payer: BC Managed Care – PPO | Admitting: Ophthalmology

## 2021-08-31 ENCOUNTER — Other Ambulatory Visit: Payer: Self-pay

## 2021-08-31 ENCOUNTER — Ambulatory Visit (INDEPENDENT_AMBULATORY_CARE_PROVIDER_SITE_OTHER): Payer: BC Managed Care – PPO | Admitting: Ophthalmology

## 2021-08-31 ENCOUNTER — Encounter (INDEPENDENT_AMBULATORY_CARE_PROVIDER_SITE_OTHER): Payer: Self-pay | Admitting: Ophthalmology

## 2021-08-31 DIAGNOSIS — H33301 Unspecified retinal break, right eye: Secondary | ICD-10-CM

## 2021-08-31 DIAGNOSIS — H33322 Round hole, left eye: Secondary | ICD-10-CM | POA: Diagnosis not present

## 2021-08-31 DIAGNOSIS — H3581 Retinal edema: Secondary | ICD-10-CM

## 2021-08-31 DIAGNOSIS — H35412 Lattice degeneration of retina, left eye: Secondary | ICD-10-CM | POA: Diagnosis not present

## 2021-09-02 ENCOUNTER — Encounter (INDEPENDENT_AMBULATORY_CARE_PROVIDER_SITE_OTHER): Payer: Self-pay | Admitting: Ophthalmology

## 2021-09-02 DIAGNOSIS — R051 Acute cough: Secondary | ICD-10-CM | POA: Diagnosis not present

## 2021-09-02 DIAGNOSIS — H109 Unspecified conjunctivitis: Secondary | ICD-10-CM | POA: Diagnosis not present

## 2021-10-03 DIAGNOSIS — R059 Cough, unspecified: Secondary | ICD-10-CM | POA: Diagnosis not present

## 2021-11-22 NOTE — Progress Notes (Shared)
[facility] - Clinic Note  11/30/2021     CHIEF COMPLAINT Patient presents for No chief complaint on file.    HISTORY OF PRESENT ILLNESS: Kelsey Cox is a 41 y.o. female who presents to the clinic today for:      Referring physician: No referring provider defined for this encounter.  HISTORICAL INFORMATION:   Selected notes from the MEDICAL RECORD NUMBER Referred by Dr. Alben Spittle for eval of lattice/retinal holes   CURRENT MEDICATIONS: No current outpatient medications on file. (Ophthalmic Drugs)   No current facility-administered medications for this visit. (Ophthalmic Drugs)   Current Outpatient Medications (Other)  Medication Sig   ibuprofen (ADVIL,MOTRIN) 600 MG tablet Take 1 tablet (600 mg total) by mouth every 6 (six) hours. (Patient not taking: Reported on 08/31/2021)   Molnupiravir 200 MG CAPS Take 4 capsules by mouth every 12 hours (Patient not taking: Reported on 08/31/2021)   oxyCODONE (OXY IR/ROXICODONE) 5 MG immediate release tablet Take 1 tablet (5 mg total) by mouth every 4 (four) hours as needed (pain scale 4-7). (Patient not taking: Reported on 08/31/2021)   Prenatal Vit-Fe Fumarate-FA (PRENATAL MULTIVITAMIN) TABS tablet Take 1 tablet by mouth daily at 12 noon. (Patient not taking: Reported on 08/31/2021)   No current facility-administered medications for this visit. 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   Renal stone    age 42, passed   UTI (urinary tract infection)    Yeast infection    chronic   Past Surgical History:  Procedure Laterality Date   CESAREAN SECTION N/A 06/01/2014   Procedure: CESAREAN SECTION;  Surgeon: Lenoard Aden, MD;  Location: [facility];  Service: Obstetrics;  Laterality: N/A;   HYSTEROSCOPY      with polyp resection   WISDOM TOOTH EXTRACTION      FAMILY HISTORY Family History  Problem Relation Age of Onset   Diabetes Father    Hypertension Father    Breast cancer Mother 97   Diabetes Paternal Grandmother    Cancer Paternal Grandmother        GI cancer, lung cancer   Breast cancer Maternal Grandmother 67   Heart attack Paternal Grandfather     SOCIAL HISTORY Social History   Tobacco Use   Smoking status: Never   Smokeless tobacco: Never  Vaping Use   Vaping Use: Never used  Substance Use Topics   Alcohol use: Not Currently    Alcohol/week: 7.0 standard drinks    Types: 7 Standard drinks or equivalent per week    Comment: NONE WHILE PREG   Drug use: No       OPHTHALMIC EXAM:  Not recorded     IMAGING AND PROCEDURES  Imaging and Procedures for 11/30/2021           ASSESSMENT/PLAN:  No diagnosis found.    1,2. Lattice degeneration w/ atrophic holes, left eye - superior lattice from 1230-0130 with multiple atrophic holes, round hole at 1230 with trace cuff of SRF; small focal patch of lattice at 0300 - s/p laser retinopexy OS (03.04.22) -- good laser changes in place - f/u in 3 months, sooner prn -- DFE/OCT  3. VR tufts / retinal defects at 1030, 1200 and 0100 OD -- no  RT or SRF  - discussed findings, prognosis and possible treatment  - s/p laser retinopexy OD (11.07.22) -- good laser in place - completed  Lotemax SM QID OD x5 days   - f/u 3 months, DFE, OCT  Ophthalmic Meds Ordered this visit:  No orders of the defined types were placed in this encounter.     No follow-ups on file.  There are no Patient Instructions on file for this visit.  This document serves as a record of services personally performed by Karie Chimera, MD, PhD. It was created on their behalf by De Blanch, an ophthalmic technician. The creation of this record is the provider's dictation and/or activities during the visit.    Electronically signed by:  De Blanch, OA, 11/22/21  10:35 AM   Karie Chimera, M.D., Ph.D. Diseases & Surgery of the Retina and Vitreous [facility]  I have reviewed the above documentation for accuracy and completeness, and I agree with the above. Karie Chimera, M.D., Ph.D. 09/02/21 10:34 AM   Abbreviations: M myopia (nearsighted); A astigmatism; H hyperopia (farsighted); P presbyopia; Mrx spectacle prescription;  CTL contact lenses; OD right eye; OS left eye; OU both eyes  XT exotropia; ET esotropia; PEK punctate epithelial keratitis; PEE punctate epithelial erosions; DES dry eye syndrome; MGD meibomian gland dysfunction; ATs artificial tears; PFAT's preservative free artificial tears; NSC nuclear sclerotic cataract; PSC posterior subcapsular cataract; ERM epi-retinal membrane; PVD posterior vitreous detachment; RD retinal detachment; DM diabetes mellitus; DR diabetic retinopathy; NPDR non-proliferative diabetic retinopathy; PDR proliferative diabetic retinopathy; CSME clinically significant macular edema; DME diabetic macular edema; dbh dot blot hemorrhages; CWS cotton wool spot; POAG primary open angle glaucoma; C/D cup-to-disc ratio; HVF humphrey visual field; GVF goldmann visual field; OCT optical coherence tomography; IOP intraocular pressure; BRVO Branch retinal vein occlusion; CRVO central retinal vein occlusion; CRAO central retinal artery occlusion; BRAO branch retinal artery occlusion; RT retinal tear; SB scleral buckle; PPV pars plana vitrectomy; VH Vitreous hemorrhage; PRP panretinal laser photocoagulation; IVK intravitreal kenalog; VMT vitreomacular traction; MH Macular hole;  NVD neovascularization of the disc; NVE neovascularization elsewhere; AREDS age related eye disease study; ARMD age related macular degeneration; POAG primary open angle glaucoma; EBMD epithelial/anterior basement membrane dystrophy; ACIOL anterior chamber intraocular lens; IOL intraocular lens; PCIOL posterior  chamber intraocular lens; Phaco/IOL phacoemulsification with intraocular lens placement; PRK photorefractive keratectomy; LASIK laser assisted in situ keratomileusis; HTN hypertension; DM diabetes mellitus; COPD chronic obstructive pulmonary disease

## 2021-11-30 ENCOUNTER — Ambulatory Visit (INDEPENDENT_AMBULATORY_CARE_PROVIDER_SITE_OTHER): Payer: BC Managed Care – PPO | Admitting: Ophthalmology

## 2021-11-30 ENCOUNTER — Encounter (INDEPENDENT_AMBULATORY_CARE_PROVIDER_SITE_OTHER): Payer: Self-pay | Admitting: Ophthalmology

## 2021-11-30 ENCOUNTER — Other Ambulatory Visit: Payer: Self-pay

## 2021-11-30 DIAGNOSIS — H33322 Round hole, left eye: Secondary | ICD-10-CM

## 2021-11-30 DIAGNOSIS — H35412 Lattice degeneration of retina, left eye: Secondary | ICD-10-CM

## 2021-11-30 DIAGNOSIS — H33301 Unspecified retinal break, right eye: Secondary | ICD-10-CM

## 2021-12-02 ENCOUNTER — Encounter (INDEPENDENT_AMBULATORY_CARE_PROVIDER_SITE_OTHER): Payer: Self-pay | Admitting: Ophthalmology

## 2021-12-19 DIAGNOSIS — R7989 Other specified abnormal findings of blood chemistry: Secondary | ICD-10-CM | POA: Diagnosis not present

## 2021-12-19 DIAGNOSIS — Z0000 Encounter for general adult medical examination without abnormal findings: Secondary | ICD-10-CM | POA: Diagnosis not present

## 2021-12-19 DIAGNOSIS — E049 Nontoxic goiter, unspecified: Secondary | ICD-10-CM | POA: Diagnosis not present

## 2022-01-04 DIAGNOSIS — Z0000 Encounter for general adult medical examination without abnormal findings: Secondary | ICD-10-CM | POA: Diagnosis not present

## 2022-01-04 DIAGNOSIS — Z1331 Encounter for screening for depression: Secondary | ICD-10-CM | POA: Diagnosis not present

## 2022-01-04 DIAGNOSIS — Z1339 Encounter for screening examination for other mental health and behavioral disorders: Secondary | ICD-10-CM | POA: Diagnosis not present

## 2022-04-24 DIAGNOSIS — Z0142 Encounter for cervical smear to confirm findings of recent normal smear following initial abnormal smear: Secondary | ICD-10-CM | POA: Diagnosis not present

## 2022-04-24 DIAGNOSIS — Z01411 Encounter for gynecological examination (general) (routine) with abnormal findings: Secondary | ICD-10-CM | POA: Diagnosis not present

## 2022-04-24 DIAGNOSIS — R635 Abnormal weight gain: Secondary | ICD-10-CM | POA: Diagnosis not present

## 2022-04-24 DIAGNOSIS — Z124 Encounter for screening for malignant neoplasm of cervix: Secondary | ICD-10-CM | POA: Diagnosis not present

## 2022-04-24 DIAGNOSIS — Z01419 Encounter for gynecological examination (general) (routine) without abnormal findings: Secondary | ICD-10-CM | POA: Diagnosis not present

## 2022-04-24 DIAGNOSIS — Z1231 Encounter for screening mammogram for malignant neoplasm of breast: Secondary | ICD-10-CM | POA: Diagnosis not present

## 2022-04-24 DIAGNOSIS — Z803 Family history of malignant neoplasm of breast: Secondary | ICD-10-CM | POA: Diagnosis not present

## 2022-04-24 DIAGNOSIS — Z6821 Body mass index (BMI) 21.0-21.9, adult: Secondary | ICD-10-CM | POA: Diagnosis not present

## 2022-06-15 DIAGNOSIS — D2261 Melanocytic nevi of right upper limb, including shoulder: Secondary | ICD-10-CM | POA: Diagnosis not present

## 2022-06-15 DIAGNOSIS — D2271 Melanocytic nevi of right lower limb, including hip: Secondary | ICD-10-CM | POA: Diagnosis not present

## 2022-06-15 DIAGNOSIS — L813 Cafe au lait spots: Secondary | ICD-10-CM | POA: Diagnosis not present

## 2022-06-15 DIAGNOSIS — D485 Neoplasm of uncertain behavior of skin: Secondary | ICD-10-CM | POA: Diagnosis not present

## 2022-06-15 DIAGNOSIS — L720 Epidermal cyst: Secondary | ICD-10-CM | POA: Diagnosis not present

## 2022-06-15 DIAGNOSIS — L218 Other seborrheic dermatitis: Secondary | ICD-10-CM | POA: Diagnosis not present

## 2022-06-21 DIAGNOSIS — G43909 Migraine, unspecified, not intractable, without status migrainosus: Secondary | ICD-10-CM | POA: Diagnosis not present

## 2022-06-21 DIAGNOSIS — R42 Dizziness and giddiness: Secondary | ICD-10-CM | POA: Diagnosis not present

## 2022-06-21 DIAGNOSIS — F419 Anxiety disorder, unspecified: Secondary | ICD-10-CM | POA: Diagnosis not present

## 2022-07-05 DIAGNOSIS — M9901 Segmental and somatic dysfunction of cervical region: Secondary | ICD-10-CM | POA: Diagnosis not present

## 2022-07-05 DIAGNOSIS — M9902 Segmental and somatic dysfunction of thoracic region: Secondary | ICD-10-CM | POA: Diagnosis not present

## 2022-07-05 DIAGNOSIS — M531 Cervicobrachial syndrome: Secondary | ICD-10-CM | POA: Diagnosis not present

## 2022-07-10 DIAGNOSIS — M9901 Segmental and somatic dysfunction of cervical region: Secondary | ICD-10-CM | POA: Diagnosis not present

## 2022-07-10 DIAGNOSIS — M531 Cervicobrachial syndrome: Secondary | ICD-10-CM | POA: Diagnosis not present

## 2022-07-10 DIAGNOSIS — M9902 Segmental and somatic dysfunction of thoracic region: Secondary | ICD-10-CM | POA: Diagnosis not present

## 2022-07-12 DIAGNOSIS — M9902 Segmental and somatic dysfunction of thoracic region: Secondary | ICD-10-CM | POA: Diagnosis not present

## 2022-07-12 DIAGNOSIS — M9901 Segmental and somatic dysfunction of cervical region: Secondary | ICD-10-CM | POA: Diagnosis not present

## 2022-07-12 DIAGNOSIS — M531 Cervicobrachial syndrome: Secondary | ICD-10-CM | POA: Diagnosis not present

## 2022-07-17 DIAGNOSIS — M9901 Segmental and somatic dysfunction of cervical region: Secondary | ICD-10-CM | POA: Diagnosis not present

## 2022-07-17 DIAGNOSIS — M531 Cervicobrachial syndrome: Secondary | ICD-10-CM | POA: Diagnosis not present

## 2022-07-17 DIAGNOSIS — M9902 Segmental and somatic dysfunction of thoracic region: Secondary | ICD-10-CM | POA: Diagnosis not present

## 2022-07-19 DIAGNOSIS — M9902 Segmental and somatic dysfunction of thoracic region: Secondary | ICD-10-CM | POA: Diagnosis not present

## 2022-07-19 DIAGNOSIS — M9901 Segmental and somatic dysfunction of cervical region: Secondary | ICD-10-CM | POA: Diagnosis not present

## 2022-07-19 DIAGNOSIS — M531 Cervicobrachial syndrome: Secondary | ICD-10-CM | POA: Diagnosis not present

## 2022-07-31 DIAGNOSIS — M531 Cervicobrachial syndrome: Secondary | ICD-10-CM | POA: Diagnosis not present

## 2022-07-31 DIAGNOSIS — M9901 Segmental and somatic dysfunction of cervical region: Secondary | ICD-10-CM | POA: Diagnosis not present

## 2022-07-31 DIAGNOSIS — M9902 Segmental and somatic dysfunction of thoracic region: Secondary | ICD-10-CM | POA: Diagnosis not present

## 2022-08-05 DIAGNOSIS — M9901 Segmental and somatic dysfunction of cervical region: Secondary | ICD-10-CM | POA: Diagnosis not present

## 2022-08-05 DIAGNOSIS — M9902 Segmental and somatic dysfunction of thoracic region: Secondary | ICD-10-CM | POA: Diagnosis not present

## 2022-08-05 DIAGNOSIS — M531 Cervicobrachial syndrome: Secondary | ICD-10-CM | POA: Diagnosis not present

## 2022-08-09 DIAGNOSIS — M531 Cervicobrachial syndrome: Secondary | ICD-10-CM | POA: Diagnosis not present

## 2022-08-09 DIAGNOSIS — M9902 Segmental and somatic dysfunction of thoracic region: Secondary | ICD-10-CM | POA: Diagnosis not present

## 2022-08-09 DIAGNOSIS — M9901 Segmental and somatic dysfunction of cervical region: Secondary | ICD-10-CM | POA: Diagnosis not present

## 2022-08-16 DIAGNOSIS — M9902 Segmental and somatic dysfunction of thoracic region: Secondary | ICD-10-CM | POA: Diagnosis not present

## 2022-08-16 DIAGNOSIS — M9901 Segmental and somatic dysfunction of cervical region: Secondary | ICD-10-CM | POA: Diagnosis not present

## 2022-08-16 DIAGNOSIS — M531 Cervicobrachial syndrome: Secondary | ICD-10-CM | POA: Diagnosis not present

## 2022-08-28 DIAGNOSIS — H9313 Tinnitus, bilateral: Secondary | ICD-10-CM | POA: Diagnosis not present

## 2022-08-28 DIAGNOSIS — H6993 Unspecified Eustachian tube disorder, bilateral: Secondary | ICD-10-CM | POA: Diagnosis not present

## 2022-09-04 DIAGNOSIS — D485 Neoplasm of uncertain behavior of skin: Secondary | ICD-10-CM | POA: Diagnosis not present

## 2022-09-04 DIAGNOSIS — D2261 Melanocytic nevi of right upper limb, including shoulder: Secondary | ICD-10-CM | POA: Diagnosis not present

## 2022-10-28 DIAGNOSIS — M531 Cervicobrachial syndrome: Secondary | ICD-10-CM | POA: Diagnosis not present

## 2022-10-28 DIAGNOSIS — M9901 Segmental and somatic dysfunction of cervical region: Secondary | ICD-10-CM | POA: Diagnosis not present

## 2022-10-28 DIAGNOSIS — M9902 Segmental and somatic dysfunction of thoracic region: Secondary | ICD-10-CM | POA: Diagnosis not present

## 2022-11-01 DIAGNOSIS — H9313 Tinnitus, bilateral: Secondary | ICD-10-CM | POA: Diagnosis not present

## 2022-11-01 DIAGNOSIS — H6993 Unspecified Eustachian tube disorder, bilateral: Secondary | ICD-10-CM | POA: Diagnosis not present

## 2022-11-01 DIAGNOSIS — G43809 Other migraine, not intractable, without status migrainosus: Secondary | ICD-10-CM | POA: Diagnosis not present

## 2022-11-01 DIAGNOSIS — Z0110 Encounter for examination of ears and hearing without abnormal findings: Secondary | ICD-10-CM | POA: Diagnosis not present

## 2022-11-25 DIAGNOSIS — M531 Cervicobrachial syndrome: Secondary | ICD-10-CM | POA: Diagnosis not present

## 2022-11-25 DIAGNOSIS — M9902 Segmental and somatic dysfunction of thoracic region: Secondary | ICD-10-CM | POA: Diagnosis not present

## 2022-11-25 DIAGNOSIS — M9901 Segmental and somatic dysfunction of cervical region: Secondary | ICD-10-CM | POA: Diagnosis not present

## 2022-11-27 DIAGNOSIS — H43393 Other vitreous opacities, bilateral: Secondary | ICD-10-CM | POA: Diagnosis not present

## 2022-11-27 DIAGNOSIS — H33322 Round hole, left eye: Secondary | ICD-10-CM | POA: Diagnosis not present

## 2022-11-27 DIAGNOSIS — H04123 Dry eye syndrome of bilateral lacrimal glands: Secondary | ICD-10-CM | POA: Diagnosis not present

## 2022-11-27 DIAGNOSIS — H524 Presbyopia: Secondary | ICD-10-CM | POA: Diagnosis not present

## 2022-11-27 DIAGNOSIS — H35412 Lattice degeneration of retina, left eye: Secondary | ICD-10-CM | POA: Diagnosis not present

## 2023-01-03 DIAGNOSIS — R7989 Other specified abnormal findings of blood chemistry: Secondary | ICD-10-CM | POA: Diagnosis not present

## 2023-01-03 DIAGNOSIS — F419 Anxiety disorder, unspecified: Secondary | ICD-10-CM | POA: Diagnosis not present

## 2023-01-03 DIAGNOSIS — E049 Nontoxic goiter, unspecified: Secondary | ICD-10-CM | POA: Diagnosis not present

## 2023-01-10 DIAGNOSIS — F419 Anxiety disorder, unspecified: Secondary | ICD-10-CM | POA: Diagnosis not present

## 2023-01-10 DIAGNOSIS — G43909 Migraine, unspecified, not intractable, without status migrainosus: Secondary | ICD-10-CM | POA: Diagnosis not present

## 2023-01-10 DIAGNOSIS — J3489 Other specified disorders of nose and nasal sinuses: Secondary | ICD-10-CM | POA: Diagnosis not present

## 2023-01-10 DIAGNOSIS — R5383 Other fatigue: Secondary | ICD-10-CM | POA: Diagnosis not present

## 2023-01-10 DIAGNOSIS — Z1331 Encounter for screening for depression: Secondary | ICD-10-CM | POA: Diagnosis not present

## 2023-01-10 DIAGNOSIS — Z0000 Encounter for general adult medical examination without abnormal findings: Secondary | ICD-10-CM | POA: Diagnosis not present

## 2023-01-10 DIAGNOSIS — L989 Disorder of the skin and subcutaneous tissue, unspecified: Secondary | ICD-10-CM | POA: Diagnosis not present

## 2023-01-10 DIAGNOSIS — Z1339 Encounter for screening examination for other mental health and behavioral disorders: Secondary | ICD-10-CM | POA: Diagnosis not present

## 2023-02-12 DIAGNOSIS — H6002 Abscess of left external ear: Secondary | ICD-10-CM | POA: Diagnosis not present

## 2023-06-27 DIAGNOSIS — M531 Cervicobrachial syndrome: Secondary | ICD-10-CM | POA: Diagnosis not present

## 2023-06-27 DIAGNOSIS — M9902 Segmental and somatic dysfunction of thoracic region: Secondary | ICD-10-CM | POA: Diagnosis not present

## 2023-06-27 DIAGNOSIS — M9901 Segmental and somatic dysfunction of cervical region: Secondary | ICD-10-CM | POA: Diagnosis not present

## 2023-07-04 DIAGNOSIS — M9902 Segmental and somatic dysfunction of thoracic region: Secondary | ICD-10-CM | POA: Diagnosis not present

## 2023-07-04 DIAGNOSIS — M9901 Segmental and somatic dysfunction of cervical region: Secondary | ICD-10-CM | POA: Diagnosis not present

## 2023-07-04 DIAGNOSIS — M531 Cervicobrachial syndrome: Secondary | ICD-10-CM | POA: Diagnosis not present

## 2023-08-15 DIAGNOSIS — Z124 Encounter for screening for malignant neoplasm of cervix: Secondary | ICD-10-CM | POA: Diagnosis not present

## 2023-08-15 DIAGNOSIS — Z1331 Encounter for screening for depression: Secondary | ICD-10-CM | POA: Diagnosis not present

## 2023-08-15 DIAGNOSIS — Z01419 Encounter for gynecological examination (general) (routine) without abnormal findings: Secondary | ICD-10-CM | POA: Diagnosis not present

## 2023-08-15 DIAGNOSIS — N914 Secondary oligomenorrhea: Secondary | ICD-10-CM | POA: Diagnosis not present

## 2023-08-15 DIAGNOSIS — Z01411 Encounter for gynecological examination (general) (routine) with abnormal findings: Secondary | ICD-10-CM | POA: Diagnosis not present

## 2023-09-05 DIAGNOSIS — N951 Menopausal and female climacteric states: Secondary | ICD-10-CM | POA: Diagnosis not present

## 2023-09-24 DIAGNOSIS — L738 Other specified follicular disorders: Secondary | ICD-10-CM | POA: Diagnosis not present

## 2023-09-24 DIAGNOSIS — D2262 Melanocytic nevi of left upper limb, including shoulder: Secondary | ICD-10-CM | POA: Diagnosis not present

## 2023-09-24 DIAGNOSIS — L723 Sebaceous cyst: Secondary | ICD-10-CM | POA: Diagnosis not present

## 2023-09-24 DIAGNOSIS — D2261 Melanocytic nevi of right upper limb, including shoulder: Secondary | ICD-10-CM | POA: Diagnosis not present

## 2023-10-08 DIAGNOSIS — M531 Cervicobrachial syndrome: Secondary | ICD-10-CM | POA: Diagnosis not present

## 2023-10-08 DIAGNOSIS — M9902 Segmental and somatic dysfunction of thoracic region: Secondary | ICD-10-CM | POA: Diagnosis not present

## 2023-10-08 DIAGNOSIS — M9901 Segmental and somatic dysfunction of cervical region: Secondary | ICD-10-CM | POA: Diagnosis not present

## 2024-01-14 DIAGNOSIS — L218 Other seborrheic dermatitis: Secondary | ICD-10-CM | POA: Diagnosis not present

## 2024-01-14 DIAGNOSIS — L700 Acne vulgaris: Secondary | ICD-10-CM | POA: Diagnosis not present

## 2024-01-16 DIAGNOSIS — E049 Nontoxic goiter, unspecified: Secondary | ICD-10-CM | POA: Diagnosis not present

## 2024-01-16 DIAGNOSIS — R5383 Other fatigue: Secondary | ICD-10-CM | POA: Diagnosis not present

## 2024-01-21 DIAGNOSIS — Z0000 Encounter for general adult medical examination without abnormal findings: Secondary | ICD-10-CM | POA: Diagnosis not present

## 2024-01-21 DIAGNOSIS — J3489 Other specified disorders of nose and nasal sinuses: Secondary | ICD-10-CM | POA: Diagnosis not present

## 2024-01-21 DIAGNOSIS — Z1331 Encounter for screening for depression: Secondary | ICD-10-CM | POA: Diagnosis not present

## 2024-01-21 DIAGNOSIS — Z1339 Encounter for screening examination for other mental health and behavioral disorders: Secondary | ICD-10-CM | POA: Diagnosis not present

## 2024-03-19 DIAGNOSIS — H33322 Round hole, left eye: Secondary | ICD-10-CM | POA: Diagnosis not present

## 2024-03-19 DIAGNOSIS — H35412 Lattice degeneration of retina, left eye: Secondary | ICD-10-CM | POA: Diagnosis not present

## 2024-03-19 DIAGNOSIS — H04123 Dry eye syndrome of bilateral lacrimal glands: Secondary | ICD-10-CM | POA: Diagnosis not present

## 2024-03-19 DIAGNOSIS — H43393 Other vitreous opacities, bilateral: Secondary | ICD-10-CM | POA: Diagnosis not present

## 2024-08-20 DIAGNOSIS — Z01411 Encounter for gynecological examination (general) (routine) with abnormal findings: Secondary | ICD-10-CM | POA: Diagnosis not present

## 2024-08-20 DIAGNOSIS — F419 Anxiety disorder, unspecified: Secondary | ICD-10-CM | POA: Diagnosis not present

## 2024-08-20 DIAGNOSIS — Z1331 Encounter for screening for depression: Secondary | ICD-10-CM | POA: Diagnosis not present

## 2024-08-20 DIAGNOSIS — Z1231 Encounter for screening mammogram for malignant neoplasm of breast: Secondary | ICD-10-CM | POA: Diagnosis not present

## 2024-08-20 DIAGNOSIS — N951 Menopausal and female climacteric states: Secondary | ICD-10-CM | POA: Diagnosis not present

## 2024-08-20 DIAGNOSIS — Z803 Family history of malignant neoplasm of breast: Secondary | ICD-10-CM | POA: Diagnosis not present

## 2024-08-20 DIAGNOSIS — Z01419 Encounter for gynecological examination (general) (routine) without abnormal findings: Secondary | ICD-10-CM | POA: Diagnosis not present

## 2024-08-20 DIAGNOSIS — Z124 Encounter for screening for malignant neoplasm of cervix: Secondary | ICD-10-CM | POA: Diagnosis not present
